# Patient Record
Sex: Female | Born: 1975 | Race: Black or African American | Hispanic: No | Marital: Married | Smoking: Never smoker
Health system: Southern US, Community
[De-identification: ages and names within clinical notes are randomized; demographics above are authoritative.]

## PROBLEM LIST (undated history)

## (undated) DIAGNOSIS — D649 Anemia, unspecified: Secondary | ICD-10-CM

## (undated) DIAGNOSIS — F32A Depression, unspecified: Secondary | ICD-10-CM

## (undated) DIAGNOSIS — J45909 Unspecified asthma, uncomplicated: Secondary | ICD-10-CM

## (undated) DIAGNOSIS — F329 Major depressive disorder, single episode, unspecified: Secondary | ICD-10-CM

## (undated) DIAGNOSIS — F419 Anxiety disorder, unspecified: Secondary | ICD-10-CM

## (undated) DIAGNOSIS — R51 Headache: Secondary | ICD-10-CM

## (undated) DIAGNOSIS — D219 Benign neoplasm of connective and other soft tissue, unspecified: Secondary | ICD-10-CM

## (undated) HISTORY — DX: Major depressive disorder, single episode, unspecified: F32.9

## (undated) HISTORY — DX: Unspecified asthma, uncomplicated: J45.909

## (undated) HISTORY — PX: URETHRAL DILATION: SUR417

## (undated) HISTORY — DX: Benign neoplasm of connective and other soft tissue, unspecified: D21.9

## (undated) HISTORY — DX: Anxiety disorder, unspecified: F41.9

## (undated) HISTORY — PX: KNEE SURGERY: SHX244

## (undated) HISTORY — DX: Depression, unspecified: F32.A

---

## 1997-11-07 ENCOUNTER — Emergency Department (HOSPITAL_COMMUNITY): Admission: EM | Admit: 1997-11-07 | Discharge: 1997-11-07 | Payer: Self-pay | Admitting: Emergency Medicine

## 1997-11-13 ENCOUNTER — Inpatient Hospital Stay (HOSPITAL_COMMUNITY): Admission: AD | Admit: 1997-11-13 | Discharge: 1997-11-13 | Payer: Self-pay | Admitting: Obstetrics

## 1998-01-20 ENCOUNTER — Other Ambulatory Visit: Admission: RE | Admit: 1998-01-20 | Discharge: 1998-01-20 | Payer: Self-pay | Admitting: Obstetrics

## 1998-12-18 ENCOUNTER — Inpatient Hospital Stay (HOSPITAL_COMMUNITY): Admission: AD | Admit: 1998-12-18 | Discharge: 1998-12-18 | Payer: Self-pay | Admitting: Obstetrics

## 1999-01-15 ENCOUNTER — Other Ambulatory Visit: Admission: RE | Admit: 1999-01-15 | Discharge: 1999-01-15 | Payer: Self-pay | Admitting: Obstetrics

## 1999-02-19 ENCOUNTER — Emergency Department (HOSPITAL_COMMUNITY): Admission: EM | Admit: 1999-02-19 | Discharge: 1999-02-19 | Payer: Self-pay | Admitting: *Deleted

## 1999-04-24 ENCOUNTER — Emergency Department (HOSPITAL_COMMUNITY): Admission: EM | Admit: 1999-04-24 | Discharge: 1999-04-25 | Payer: Self-pay | Admitting: *Deleted

## 1999-04-25 ENCOUNTER — Encounter: Payer: Self-pay | Admitting: Emergency Medicine

## 1999-12-28 ENCOUNTER — Inpatient Hospital Stay (HOSPITAL_COMMUNITY): Admission: AD | Admit: 1999-12-28 | Discharge: 1999-12-28 | Payer: Self-pay | Admitting: Obstetrics

## 2000-10-12 ENCOUNTER — Other Ambulatory Visit: Admission: RE | Admit: 2000-10-12 | Discharge: 2000-10-12 | Payer: Self-pay | Admitting: Obstetrics

## 2001-04-28 ENCOUNTER — Emergency Department (HOSPITAL_COMMUNITY): Admission: EM | Admit: 2001-04-28 | Discharge: 2001-04-28 | Payer: Self-pay | Admitting: Emergency Medicine

## 2001-05-07 ENCOUNTER — Emergency Department (HOSPITAL_COMMUNITY): Admission: EM | Admit: 2001-05-07 | Discharge: 2001-05-07 | Payer: Self-pay | Admitting: Emergency Medicine

## 2001-07-14 ENCOUNTER — Emergency Department (HOSPITAL_COMMUNITY): Admission: EM | Admit: 2001-07-14 | Discharge: 2001-07-14 | Payer: Self-pay | Admitting: Emergency Medicine

## 2002-01-18 ENCOUNTER — Encounter: Payer: Self-pay | Admitting: *Deleted

## 2002-01-18 ENCOUNTER — Emergency Department (HOSPITAL_COMMUNITY): Admission: EM | Admit: 2002-01-18 | Discharge: 2002-01-18 | Payer: Self-pay | Admitting: *Deleted

## 2002-01-19 ENCOUNTER — Emergency Department (HOSPITAL_COMMUNITY): Admission: EM | Admit: 2002-01-19 | Discharge: 2002-01-19 | Payer: Self-pay | Admitting: Emergency Medicine

## 2002-01-19 ENCOUNTER — Encounter: Payer: Self-pay | Admitting: Emergency Medicine

## 2002-05-25 ENCOUNTER — Emergency Department (HOSPITAL_COMMUNITY): Admission: EM | Admit: 2002-05-25 | Discharge: 2002-05-25 | Payer: Self-pay | Admitting: Unknown Physician Specialty

## 2002-10-11 ENCOUNTER — Emergency Department (HOSPITAL_COMMUNITY): Admission: EM | Admit: 2002-10-11 | Discharge: 2002-10-11 | Payer: Self-pay | Admitting: Emergency Medicine

## 2007-09-24 ENCOUNTER — Emergency Department (HOSPITAL_COMMUNITY): Admission: EM | Admit: 2007-09-24 | Discharge: 2007-09-24 | Payer: Self-pay | Admitting: Emergency Medicine

## 2008-04-17 ENCOUNTER — Ambulatory Visit (HOSPITAL_COMMUNITY): Admission: RE | Admit: 2008-04-17 | Discharge: 2008-04-17 | Payer: Self-pay | Admitting: Obstetrics

## 2008-06-28 ENCOUNTER — Emergency Department (HOSPITAL_COMMUNITY): Admission: EM | Admit: 2008-06-28 | Discharge: 2008-06-28 | Payer: Self-pay | Admitting: Emergency Medicine

## 2008-07-06 ENCOUNTER — Emergency Department (HOSPITAL_COMMUNITY): Admission: EM | Admit: 2008-07-06 | Discharge: 2008-07-06 | Payer: Self-pay | Admitting: Emergency Medicine

## 2010-05-24 ENCOUNTER — Encounter: Payer: Self-pay | Admitting: Obstetrics

## 2012-05-16 ENCOUNTER — Other Ambulatory Visit (HOSPITAL_COMMUNITY): Payer: Self-pay | Admitting: Obstetrics

## 2012-05-16 DIAGNOSIS — Z1231 Encounter for screening mammogram for malignant neoplasm of breast: Secondary | ICD-10-CM

## 2012-05-16 DIAGNOSIS — D259 Leiomyoma of uterus, unspecified: Secondary | ICD-10-CM

## 2012-05-16 DIAGNOSIS — N949 Unspecified condition associated with female genital organs and menstrual cycle: Secondary | ICD-10-CM

## 2012-05-25 ENCOUNTER — Ambulatory Visit (HOSPITAL_COMMUNITY): Payer: BC Managed Care – PPO

## 2012-05-25 ENCOUNTER — Ambulatory Visit (HOSPITAL_COMMUNITY)
Admission: RE | Admit: 2012-05-25 | Discharge: 2012-05-25 | Disposition: A | Discharge status: Home / Self Care | Payer: BC Managed Care – PPO | Source: Ambulatory Visit | Attending: Obstetrics | Admitting: Obstetrics

## 2012-05-25 DIAGNOSIS — Z1231 Encounter for screening mammogram for malignant neoplasm of breast: Secondary | ICD-10-CM | POA: Insufficient documentation

## 2012-05-29 ENCOUNTER — Other Ambulatory Visit: Payer: Self-pay | Admitting: Obstetrics

## 2012-05-29 DIAGNOSIS — R928 Other abnormal and inconclusive findings on diagnostic imaging of breast: Secondary | ICD-10-CM

## 2012-06-06 ENCOUNTER — Ambulatory Visit (HOSPITAL_COMMUNITY): Payer: BC Managed Care – PPO

## 2012-06-07 ENCOUNTER — Ambulatory Visit (HOSPITAL_COMMUNITY)
Admission: RE | Admit: 2012-06-07 | Discharge: 2012-06-07 | Disposition: A | Discharge status: Home / Self Care | Payer: BC Managed Care – PPO | Source: Ambulatory Visit | Attending: Obstetrics | Admitting: Obstetrics

## 2012-06-07 DIAGNOSIS — D251 Intramural leiomyoma of uterus: Secondary | ICD-10-CM | POA: Insufficient documentation

## 2012-06-07 DIAGNOSIS — N938 Other specified abnormal uterine and vaginal bleeding: Secondary | ICD-10-CM | POA: Insufficient documentation

## 2012-06-07 DIAGNOSIS — D250 Submucous leiomyoma of uterus: Secondary | ICD-10-CM | POA: Insufficient documentation

## 2012-06-07 DIAGNOSIS — N949 Unspecified condition associated with female genital organs and menstrual cycle: Secondary | ICD-10-CM | POA: Insufficient documentation

## 2012-06-07 DIAGNOSIS — D252 Subserosal leiomyoma of uterus: Secondary | ICD-10-CM | POA: Insufficient documentation

## 2012-06-07 DIAGNOSIS — D259 Leiomyoma of uterus, unspecified: Secondary | ICD-10-CM

## 2012-06-07 IMAGING — US US TRANSVAGINAL NON-OB
1 series · 13 of 25 positions shown · non-contrast
Comparison: [DATE]

CLINICAL DATA: Dysfunctional uterine bleeding, history of fibroids



[Series 1: us pelvis complete · 13 of 116 slices shown]
[im 1/116]
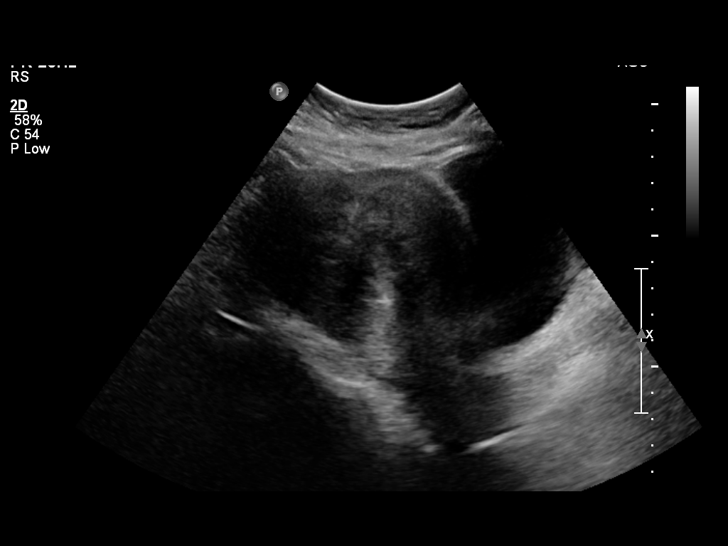
[im 10/116]
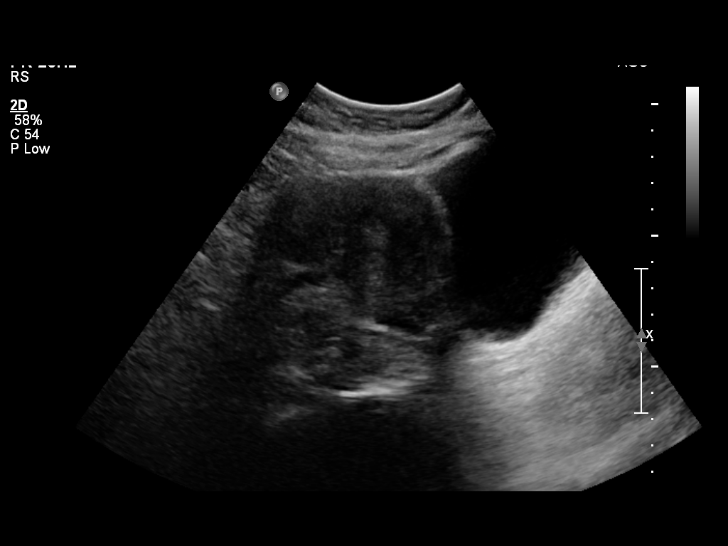
[im 20/116]
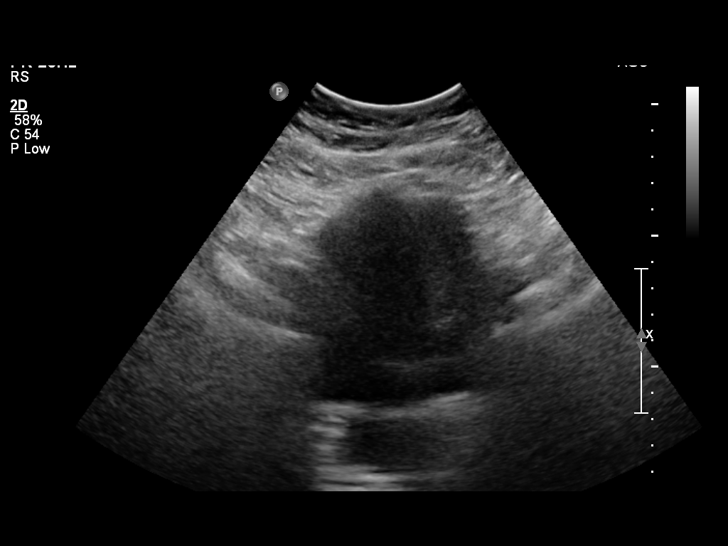
[im 29/116]
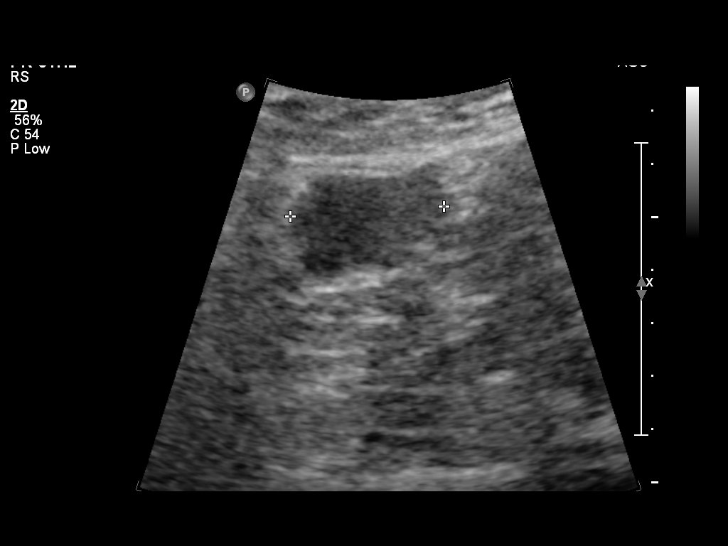
[im 39/116]
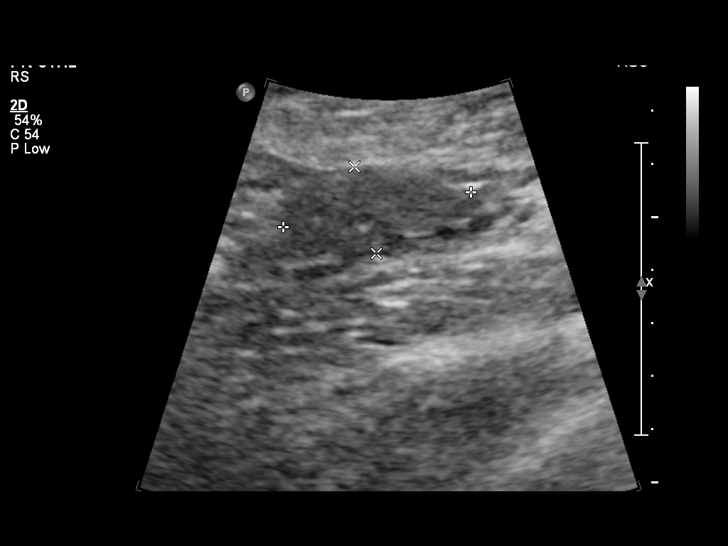
[im 48/116]
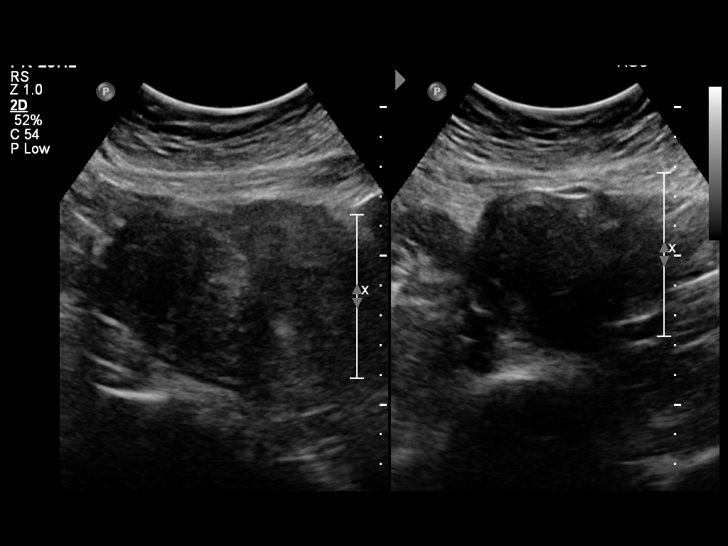
[im 58/116]
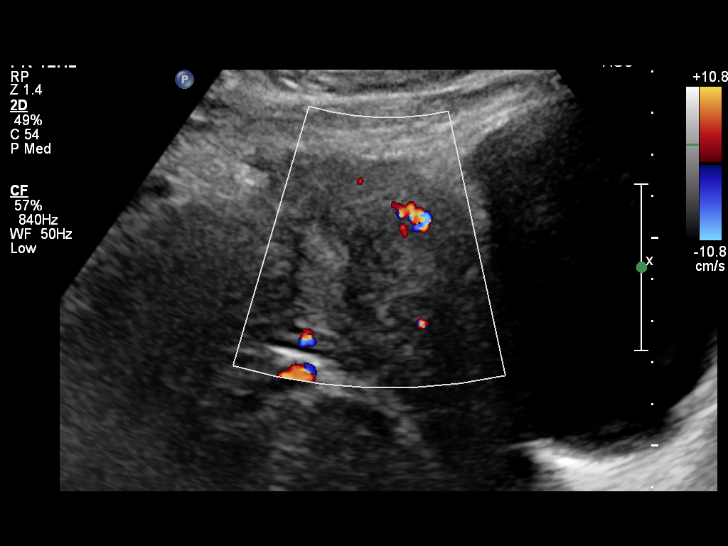
[im 68/116]
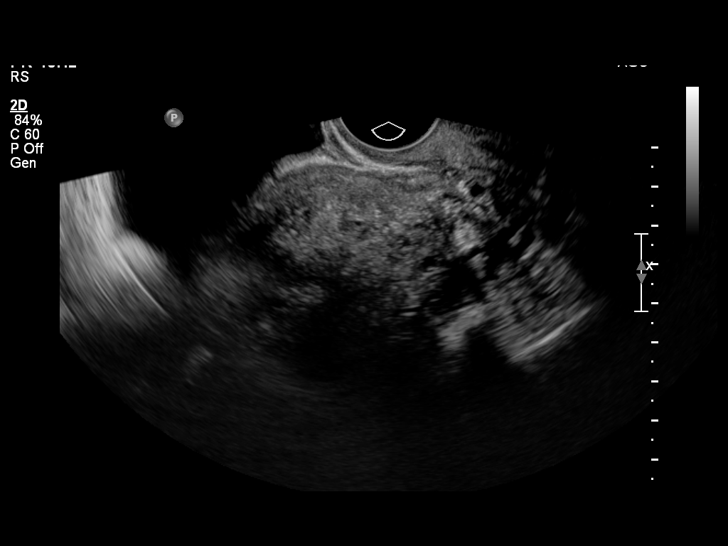
[im 77/116]
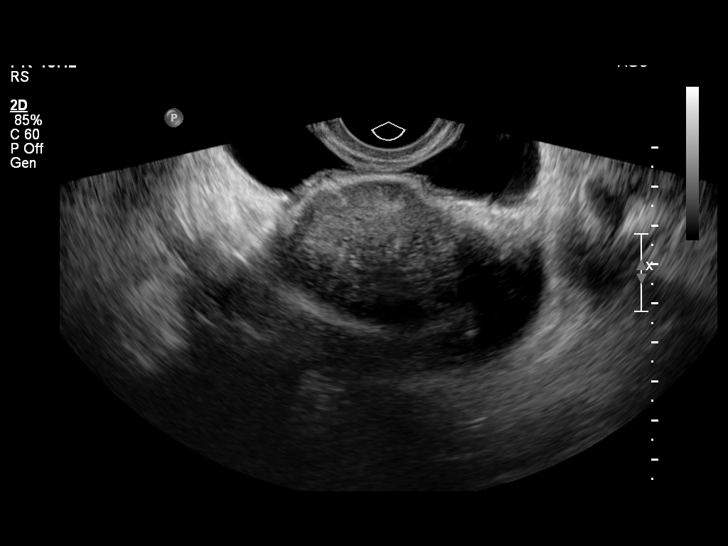
[im 87/116]
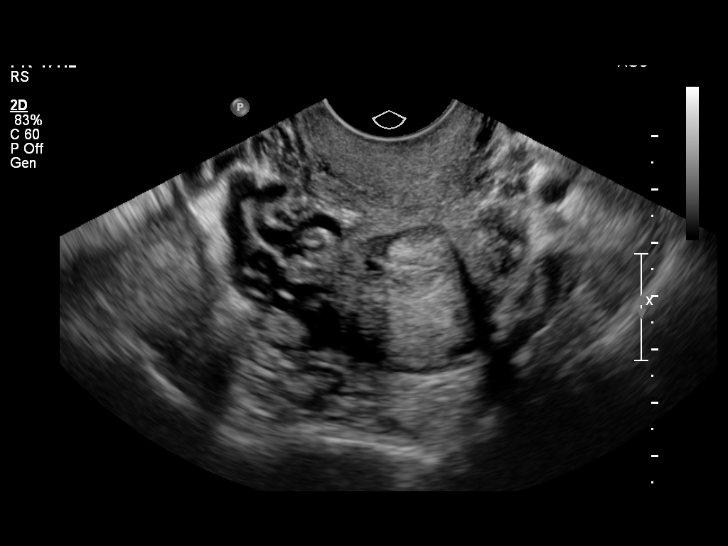
[im 96/116]
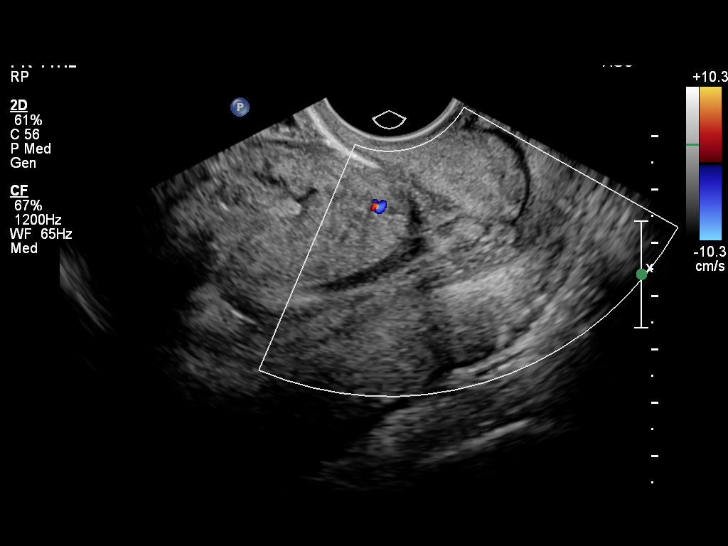
[im 106/116]
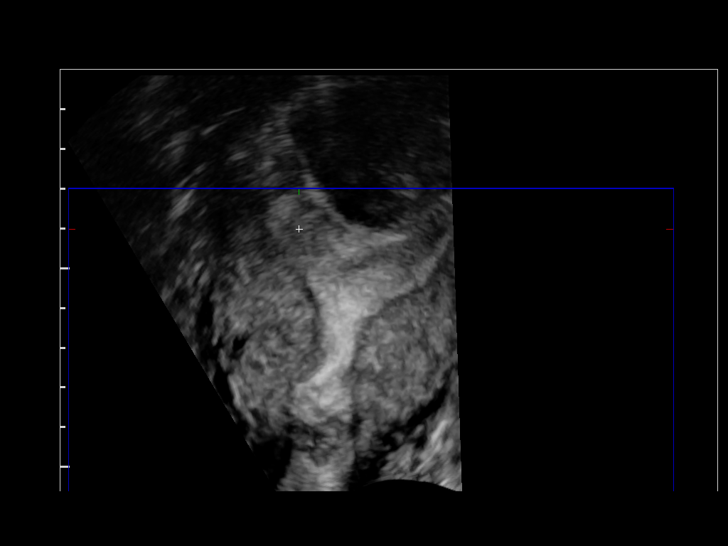
[im 116/116]
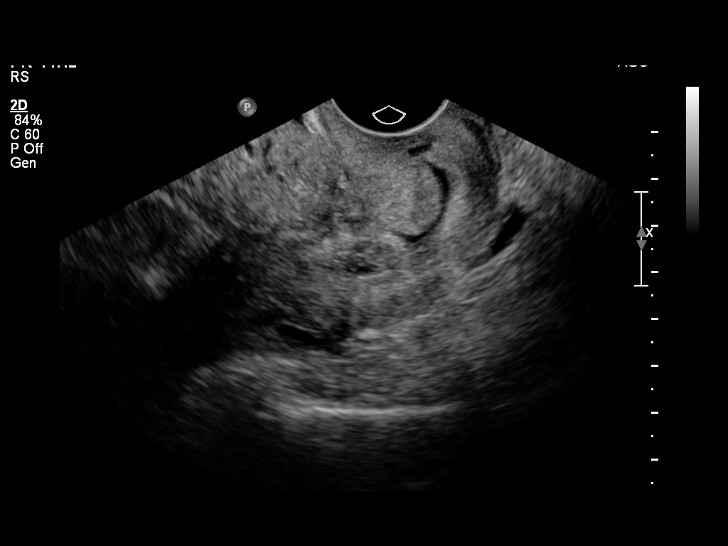

[13 of 25 positions shown; findings below may reference images not displayed]

FINDINGS: Uterus:  Anteverted, anteflexed.  11 x 8.2 x 7.3 cm.  Multiple
fibroids again noted, dominant posterior uterine body intramural
fibroid measuring 2.4 x 2.4 x 1.8 cm (smaller than previously,
previously 3.6 x 3.3 x 2.9 cm).  Other smaller intramural fibroids
are noted elsewhere without significant subserosal or submucosal
component.

Endometrium: 1.2 cm.  Inhomogeneous without focal measurable
abnormality.

Right ovary: 4.1 x 2.9 x 2.1 cm, seen only transabdominally,
normal.

Left ovary: 3.6 x 2.6 x 1.7 cm, seen transabdominally only, normal.

Other Findings:  Trace free pelvic fluid.
IMPRESSION: Uterine fibroids again noted, smaller than previously.

Mild prominence of the endometrium and inhomogeneity which could be
related to timing during the patient's menstrual cycle, although
underlying abnormality could account for the history of
dysfunctional uterine bleeding.  Repeat pelvic ultrasound during
the week immediately following the patient's menses is recommended
for better visualization.

## 2012-06-20 ENCOUNTER — Ambulatory Visit
Admission: RE | Admit: 2012-06-20 | Discharge: 2012-06-20 | Disposition: A | Discharge status: Home / Self Care | Payer: BC Managed Care – PPO | Source: Ambulatory Visit | Attending: Obstetrics | Admitting: Obstetrics

## 2012-06-20 ENCOUNTER — Other Ambulatory Visit: Payer: BC Managed Care – PPO

## 2012-06-20 ENCOUNTER — Other Ambulatory Visit: Payer: Self-pay | Admitting: Obstetrics

## 2012-06-20 DIAGNOSIS — R928 Other abnormal and inconclusive findings on diagnostic imaging of breast: Secondary | ICD-10-CM

## 2012-06-30 ENCOUNTER — Other Ambulatory Visit: Payer: Self-pay | Admitting: Radiology

## 2012-06-30 ENCOUNTER — Ambulatory Visit: Payer: BC Managed Care – PPO | Admitting: Family Medicine

## 2012-06-30 VITALS — BP 114/58 | HR 71 | Temp 98.4°F | Resp 16 | Ht 63.0 in | Wt 183.4 lb

## 2012-06-30 DIAGNOSIS — F329 Major depressive disorder, single episode, unspecified: Secondary | ICD-10-CM

## 2012-06-30 DIAGNOSIS — F411 Generalized anxiety disorder: Secondary | ICD-10-CM

## 2012-06-30 DIAGNOSIS — F32A Depression, unspecified: Secondary | ICD-10-CM

## 2012-06-30 DIAGNOSIS — D649 Anemia, unspecified: Secondary | ICD-10-CM

## 2012-06-30 DIAGNOSIS — F3289 Other specified depressive episodes: Secondary | ICD-10-CM

## 2012-06-30 LAB — COMPREHENSIVE METABOLIC PANEL
Albumin: 4.1 g/dL (ref 3.5–5.2)
Alkaline Phosphatase: 52 U/L (ref 39–117)
BUN: 10 mg/dL (ref 6–23)
CO2: 27 mEq/L (ref 19–32)
Calcium: 9.3 mg/dL (ref 8.4–10.5)
Glucose, Bld: 77 mg/dL (ref 70–99)
Potassium: 4.4 mEq/L (ref 3.5–5.3)
Sodium: 142 mEq/L (ref 135–145)
Total Protein: 7.2 g/dL (ref 6.0–8.3)

## 2012-06-30 LAB — TSH: TSH: 0.971 u[IU]/mL (ref 0.350–4.500)

## 2012-06-30 LAB — POCT CBC
Granulocyte percent: 68.8 %G (ref 37–80)
HCT, POC: 31.4 % — AB (ref 37.7–47.9)
Hemoglobin: 9.1 g/dL — AB (ref 12.2–16.2)
Lymph, poc: 4.7 — AB (ref 0.6–3.4)
MCH, POC: 20.3 pg — AB (ref 27–31.2)
MCHC: 29 g/dL — AB (ref 31.8–35.4)
MCV: 69.9 fL — AB (ref 80–97)
MID (cbc): 0.8 (ref 0–0.9)
MPV: 9.2 fL (ref 0–99.8)
POC Granulocyte: 12 — AB (ref 2–6.9)
POC LYMPH PERCENT: 26.7 %L (ref 10–50)
POC MID %: 4.5 % (ref 0–12)
Platelet Count, POC: 486 10*3/uL — AB (ref 142–424)
RBC: 4.49 M/uL (ref 4.04–5.48)
RDW, POC: 23.6 %
WBC: 17.5 10*3/uL — AB (ref 4.6–10.2)

## 2012-06-30 LAB — COMPREHENSIVE METABOLIC PANEL WITH GFR
ALT: <8 U/L (ref 0–35)
AST: 11 U/L (ref 0–37)
Chloride: 107 meq/L (ref 96–112)
Creat: 0.52 mg/dL (ref 0.50–1.10)
Total Bilirubin: 0.3 mg/dL (ref 0.3–1.2)

## 2012-06-30 MED ORDER — SERTRALINE HCL 25 MG PO TABS: 25.0000 mg | ORAL_TABLET | Freq: Every day | ORAL | Status: DC

## 2012-06-30 MED ORDER — CLONAZEPAM 1 MG PO TABS: 1.0000 mg | ORAL_TABLET | Freq: Two times a day (BID) | ORAL | Status: DC | PRN

## 2012-06-30 NOTE — Progress Notes (Signed)
[facility]:  Office Visit  Chief Complaint:  Chief Complaint  Patient presents with  . Anxiety    ATTACK 06/30/12    HPI: Julia Franklin is a 37 y.o. female who complains of  Anxiety attack today, was at work when it got triggered. Has had it for some time but today was the worsem she had CP and palpitations and had to go into another room to calm herself. 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 She took a demotion and lost $11,000  Pay to being an International aid/development worker, now there is only one manger who is working remotely so she has had to pick up the same duties she was trying to get away from in the first place. She dreams about work. There has been some new leadership changes, and there is a  Constant threat to her job  and  It has been hard for her to adjust so she stepped down. Has had mood, appetite and sleep changes due to job. Has had anxiety before but never been on medicines.  Her husband is unemployed was diagnosed with Vit K deficiency and is on Coumadin so he bleeds out easily and is unable to do the work he was doing before due to this liability. No h/o SI/HI/hallucinations/psych hospitalizations.   She is on amox for tonsillitis and also a prednisone for inflammation     Past Medical History  Diagnosis Date  . Asthma   . Anxiety   . Fibroids    Past Surgical History  Procedure Laterality Date  . Knee surgery      left  ACL  . Cesarean section  1993   History   Social History  . Marital Status: Married    Spouse Name: N/A    Number of Children: N/A  . Years of Education: N/A   Social History Main Topics  . Smoking status: Never Smoker   . Smokeless tobacco: None  . Alcohol Use: Yes     Comment: socially  . Drug  Use: No  . Sexually Active: Yes     Comment: number of sex partners in the last 12 months  1   Other Topics Concern  . None   Social History Narrative  . None   Family History  Problem Relation Age of Onset  . Hypertension Mother   . Stroke Mother   . Diabetes Father   . Stroke Father   . Parkinson's disease Maternal Grandmother   . Stroke Paternal Grandmother   . Cancer Sister     lung  . Cancer Brother     lung   No Known Allergies Prior to Admission medications   Medication Sig Start Date End Date Taking? Authorizing Provider  AMOXICILLIN PO Take by mouth 2 (two) times daily.   Yes Historical Provider, MD  PREDNISONE, PAK, PO Take by mouth daily.   Yes Historical Provider, MD     ROS: The patient currently denies fevers, chills, night sweats, unintentional weight loss, chest pain, palpitations, wheezing, dyspnea on exertion, nausea, vomiting, abdominal pain, dysuria, hematuria, melena, numbness, weakness, or tingling.   All other systems have been reviewed and were otherwise negative with the exception of those mentioned in the HPI and as  above.    PHYSICAL EXAM: Filed Vitals:   06/30/12 1349  BP: 114/58  Pulse: 71  Temp: 98.4 F (36.9 C)  Resp: 16   Filed Vitals:   06/30/12 1349  Height: 5\' 3"  (1.6 m)  Weight: 183 lb 6.4 oz (83.19 kg)   Body mass index is 32.5 kg/(m^2).  General: Alert, tearful, withdrawn HEENT:  Normocephalic, atraumatic, oropharynx patent. EOMI, PERRLA Cardiovascular:  Regular rate and rhythm, no rubs murmurs or gallops.  No Carotid bruits, radial pulse intact. No pedal edema.  Respiratory: Clear to auscultation bilaterally.  No wheezes, rales, or rhonchi.  No cyanosis, no use of accessory musculature GI: No organomegaly, abdomen is soft and non-tender, positive bowel sounds.  No masses. Skin: No rashes. Neurologic: Facial musculature symmetric. Psychiatric: Patient is appropriate throughout our interaction. She is tearful,  withdrawn Lymphatic: No cervical lymphadenopathy Musculoskeletal: Gait intact.   LABS: Results for orders placed in visit on 06/30/12  POCT CBC      Result Value Range   WBC 17.5 (*) 4.6 - 10.2 K/uL   Lymph, poc 4.7 (*) 0.6 - 3.4   POC LYMPH PERCENT 26.7  10 - 50 %L   MID (cbc) 0.8  0 - 0.9   POC MID % 4.5  0 - 12 %M   POC Granulocyte 12.0 (*) 2 - 6.9   Granulocyte percent 68.8  37 - 80 %G   RBC 4.49  4.04 - 5.48 M/uL   Hemoglobin 9.1 (*) 12.2 - 16.2 g/dL   HCT, POC 09.8 (*) 11.9 - 47.9 %   MCV 69.9 (*) 80 - 97 fL   MCH, POC 20.3 (*) 27 - 31.2 pg   MCHC 29.0 (*) 31.8 - 35.4 g/dL   RDW, POC 14.7     Platelet Count, POC 486 (*) 142 - 424 K/uL   MPV 9.2  0 - 99.8 fL     EKG/XRAY:   Primary read interpreted by Dr. Conley Rolls at [facility].   ASSESSMENT/PLAN: Encounter Diagnoses  Name Primary?  . Generalized anxiety disorder Yes  . Anemia, unspecified    Zoloft 25 mg PO daily Klonopin 1 mg  1/2 tab po BID prn  D/w patient SEs and also that I would like her to go into therapy , however at this time she is not able to to take time off to do this, she is the sole breadwinner for family,she works over 40 hrs/wk and is afraid of losing her job.  F/u prn or in 4- 6 weeks Work note given for 3 days off, Return to work on 07/03/12.  Zung Anxiety Score Anxiety index 64---Marked to Severe Anxiety Becks Depression Score 39---Severe Depression Go to ER prn if have SI/HI/Hallucinations   * Leukocytosis due to prednisone use for tonsillitis.   Hamilton Capri PHUONG, DO 06/30/2012 2:57 PM

## 2012-06-30 NOTE — Telephone Encounter (Signed)
Sig clarified on the Klonopin for patient at [facility].

## 2012-07-01 ENCOUNTER — Telehealth: Payer: Self-pay | Admitting: *Deleted

## 2012-07-20 NOTE — Telephone Encounter (Signed)
Med encounter

## 2012-08-04 ENCOUNTER — Ambulatory Visit: Payer: BC Managed Care – PPO | Admitting: Family Medicine

## 2012-12-13 ENCOUNTER — Other Ambulatory Visit: Payer: Self-pay | Admitting: Obstetrics

## 2013-01-24 ENCOUNTER — Encounter (HOSPITAL_COMMUNITY): Payer: Self-pay | Admitting: Pharmacy Technician

## 2013-01-24 ENCOUNTER — Encounter (HOSPITAL_COMMUNITY): Payer: Self-pay

## 2013-01-24 ENCOUNTER — Encounter (HOSPITAL_COMMUNITY)
Admission: RE | Admit: 2013-01-24 | Discharge: 2013-01-24 | Disposition: A | Discharge status: Home / Self Care | Payer: BC Managed Care – PPO | Source: Ambulatory Visit | Attending: Obstetrics | Admitting: Obstetrics

## 2013-01-24 DIAGNOSIS — Z01818 Encounter for other preprocedural examination: Secondary | ICD-10-CM | POA: Insufficient documentation

## 2013-01-24 DIAGNOSIS — Z01812 Encounter for preprocedural laboratory examination: Secondary | ICD-10-CM | POA: Insufficient documentation

## 2013-01-24 HISTORY — DX: Headache: R51

## 2013-01-24 HISTORY — DX: Anemia, unspecified: D64.9

## 2013-01-24 LAB — CBC
Platelets: 336 10*3/uL (ref 150–400)
RBC: 4.26 MIL/uL (ref 3.87–5.11)
RDW: 17 % — ABNORMAL HIGH (ref 11.5–15.5)
WBC: 8.8 10*3/uL (ref 4.0–10.5)

## 2013-01-24 LAB — ABO/RH: ABO/RH(D): AB POS

## 2013-01-24 LAB — TYPE AND SCREEN
ABO/RH(D): AB POS
Antibody Screen: NEGATIVE

## 2013-01-24 NOTE — Patient Instructions (Signed)
Your procedure is scheduled on: 01/31/2013  Enter through the Main Entrance of [facility] at: 0600am  Pick up the phone at the desk and dial [phone].  Call this number if you have problems the morning of surgery: [phone].  Remember: Do NOT eat food: AFTER MIDNIGHT 01/30/2013 Do NOT drink clear liquids after: AFTER MIDNIGHT 01/30/2013 Take these medicines the morning of surgery with a SIP OF WATER: N/A  Do NOT wear jewelry, make-up, or nail polish. Do NOT wear lotions, powders, or perfumes.  You may wear deoderant. Do NOT shave for 48 hours prior to surgery. Do NOT bring valuables to the hospital. Contacts, dentures, or bridgework may not be worn into surgery. Leave suitcase in car.  After surgery it may be brought to your room.  For patients admitted to the hospital, checkout time is 11:00 AM the day of discharge.

## 2013-01-26 NOTE — H&P (Signed)
NAMETRANICE, LADUKE                 ACCOUNT NO.:  [id number]  MEDICAL RECORD NO.:  [id number]  LOCATION:  PERIO                         FACILITY:  [facility]  PHYSICIAN:  Kathreen Cosier, M.D.DATE OF BIRTH:  26-May-1975  DATE OF ADMISSION:  12/13/2012 DATE OF DISCHARGE:                             HISTORY & PHYSICAL   DATE OF SURGERY:  January 31, 2013.  The patient is a 37 year old, gravida 1, para 1-0-0-1, who plans to get pregnant in the future.  She has a long history of myomas which have been increasing in size, and she was admitted for myomectomy.  PAST MEDICAL HISTORY:  Negative.  PAST SURGICAL HISTORY:  Negative.  SOCIAL HISTORY:  Negative.  SYSTEM REVIEW:  She is allergic to penicillin.  PHYSICAL EXAMINATION:  GENERAL:  A well-developed female in no distress. HEENT:  Negative. LUNGS:  Clear to P and A. HEART:  Regular rhythm.  No murmurs.  No gallops. BREASTS:  Negative. ABDOMEN:  Mass arising from the pelvis confirmed with ultrasound to view multiple myomas.  Pap negative. EXTREMITIES:  Negative.  DIAGNOSIS:  Multiple myomas for myomectomy.          ______________________________ Kathreen Cosier, M.D.     BAM/MEDQ  D:  01/25/2013  T:  01/26/2013  Job:  [id number]

## 2013-01-30 MED ORDER — CEFAZOLIN SODIUM-DEXTROSE 2-3 GM-% IV SOLR: 2.0000 g | Freq: Once | INTRAVENOUS | Status: DC

## 2013-01-31 ENCOUNTER — Inpatient Hospital Stay (HOSPITAL_COMMUNITY): Payer: BC Managed Care – PPO | Admitting: Anesthesiology

## 2013-01-31 ENCOUNTER — Inpatient Hospital Stay (HOSPITAL_COMMUNITY)
Admission: RE | Admit: 2013-01-31 | Discharge: 2013-02-02 | DRG: 359 | Disposition: A | Discharge status: Home / Self Care | Payer: BC Managed Care – PPO | Source: Ambulatory Visit | Attending: Obstetrics | Admitting: Obstetrics

## 2013-01-31 ENCOUNTER — Encounter (HOSPITAL_COMMUNITY): Payer: Self-pay | Admitting: Anesthesiology

## 2013-01-31 ENCOUNTER — Encounter (HOSPITAL_COMMUNITY)
Admission: RE | Disposition: A | Discharge status: Home / Self Care | Payer: Self-pay | Source: Ambulatory Visit | Attending: Obstetrics

## 2013-01-31 DIAGNOSIS — D251 Intramural leiomyoma of uterus: Principal | ICD-10-CM | POA: Diagnosis present

## 2013-01-31 DIAGNOSIS — Z880 Allergy status to penicillin: Secondary | ICD-10-CM

## 2013-01-31 HISTORY — PX: MYOMECTOMY: SHX85

## 2013-01-31 LAB — COMPREHENSIVE METABOLIC PANEL
ALT: 6 U/L (ref 0–35)
AST: 14 U/L (ref 0–37)
Alkaline Phosphatase: 47 U/L (ref 39–117)
BUN: 11 mg/dL (ref 6–23)
Chloride: 105 mEq/L (ref 96–112)
GFR calc Af Amer: >90 mL/min (ref >90)
GFR calc non Af Amer: >90 mL/min (ref >90)
Glucose, Bld: 133 mg/dL — ABNORMAL HIGH (ref 70–99)
Potassium: 3.9 mEq/L (ref 3.5–5.1)
Sodium: 138 mEq/L (ref 135–145)
Total Protein: 6.3 g/dL (ref 6.0–8.3)

## 2013-01-31 LAB — TYPE AND SCREEN
ABO/RH(D): AB POS
Antibody Screen: NEGATIVE

## 2013-01-31 LAB — HCG, QUANTITATIVE, PREGNANCY: hCG, Beta Chain, Quant, S: <1 m[IU]/mL (ref <5)

## 2013-01-31 SURGERY — MYOMECTOMY, ABDOMINAL APPROACH
Anesthesia: General | Site: Abdomen | Wound class: Clean

## 2013-01-31 MED ORDER — DIPHENHYDRAMINE HCL 50 MG/ML IJ SOLN: 12.5000 mg | Freq: Four times a day (QID) | INTRAMUSCULAR | Status: DC | PRN

## 2013-01-31 MED ORDER — DIPHENHYDRAMINE HCL 12.5 MG/5ML PO ELIX: 12.5000 mg | ORAL_SOLUTION | Freq: Four times a day (QID) | ORAL | Status: DC | PRN

## 2013-01-31 MED ORDER — ACETAMINOPHEN 325 MG PO TABS
650.0000 mg | ORAL_TABLET | ORAL | Status: DC | PRN
  Administered 2013-02-01: 650 mg via ORAL
  Filled 2013-01-31: qty 2

## 2013-01-31 MED ORDER — SIMETHICONE 80 MG PO CHEW
80.0000 mg | CHEWABLE_TABLET | Freq: Four times a day (QID) | ORAL | Status: DC | PRN
  Administered 2013-02-01 (×2): 80 mg via ORAL

## 2013-01-31 MED ORDER — HYDROMORPHONE HCL PF 1 MG/ML IJ SOLN
0.2500 mg | INTRAMUSCULAR | Status: DC | PRN
  Administered 2013-01-31 (×4): 0.5 mg via INTRAVENOUS

## 2013-01-31 MED ORDER — PROPOFOL 10 MG/ML IV BOLUS
INTRAVENOUS | Status: DC | PRN
  Administered 2013-01-31: 170 mg via INTRAVENOUS
  Administered 2013-01-31: 30 mg via INTRAVENOUS

## 2013-01-31 MED ORDER — ROCURONIUM BROMIDE 100 MG/10ML IV SOLN
INTRAVENOUS | Status: DC | PRN
  Administered 2013-01-31: 40 mg via INTRAVENOUS

## 2013-01-31 MED ORDER — NEOSTIGMINE METHYLSULFATE 1 MG/ML IJ SOLN
INTRAMUSCULAR | Status: DC | PRN
  Administered 2013-01-31: 4 mg via INTRAVENOUS

## 2013-01-31 MED ORDER — ONDANSETRON HCL 4 MG/2ML IJ SOLN
INTRAMUSCULAR | Status: AC
  Filled 2013-01-31: qty 2

## 2013-01-31 MED ORDER — DIPHENHYDRAMINE HCL 50 MG/ML IJ SOLN
12.5000 mg | Freq: Four times a day (QID) | INTRAMUSCULAR | Status: DC | PRN
Start: 2013-01-31 — End: 2013-01-31

## 2013-01-31 MED ORDER — ALUM & MAG HYDROXIDE-SIMETH 200-200-20 MG/5ML PO SUSP: 30.0000 mL | ORAL | Status: DC | PRN

## 2013-01-31 MED ORDER — ONDANSETRON HCL 4 MG/2ML IJ SOLN: 4.0000 mg | Freq: Four times a day (QID) | INTRAMUSCULAR | Status: DC | PRN

## 2013-01-31 MED ORDER — MIDAZOLAM HCL 2 MG/2ML IJ SOLN
INTRAMUSCULAR | Status: DC | PRN
  Administered 2013-01-31: 2 mg via INTRAVENOUS

## 2013-01-31 MED ORDER — HYDROMORPHONE HCL PF 1 MG/ML IJ SOLN
0.2000 mg | INTRAMUSCULAR | Status: DC | PRN
  Administered 2013-01-31 – 2013-02-01 (×5): 0.6 mg via INTRAVENOUS
  Filled 2013-01-31 (×6): qty 1

## 2013-01-31 MED ORDER — HYDROMORPHONE HCL PF 1 MG/ML IJ SOLN
INTRAMUSCULAR | Status: AC
  Filled 2013-01-31: qty 1

## 2013-01-31 MED ORDER — PRENATAL MULTIVITAMIN CH
1.0000 | ORAL_TABLET | Freq: Every day | ORAL | Status: DC
  Administered 2013-01-31 – 2013-02-01 (×2): 1 via ORAL
  Filled 2013-01-31 (×2): qty 1

## 2013-01-31 MED ORDER — NALOXONE HCL 0.4 MG/ML IJ SOLN: 0.4000 mg | INTRAMUSCULAR | Status: DC | PRN

## 2013-01-31 MED ORDER — OXYCODONE-ACETAMINOPHEN 5-325 MG PO TABS
1.0000 | ORAL_TABLET | ORAL | Status: DC | PRN
  Administered 2013-02-01 (×3): 2 via ORAL
  Administered 2013-02-01: 1 via ORAL
  Filled 2013-01-31 (×2): qty 2
  Filled 2013-01-31: qty 1
  Filled 2013-01-31 (×3): qty 2

## 2013-01-31 MED ORDER — FENTANYL CITRATE 0.05 MG/ML IJ SOLN
INTRAMUSCULAR | Status: AC
  Filled 2013-01-31: qty 5

## 2013-01-31 MED ORDER — CLINDAMYCIN PHOSPHATE 900 MG/50ML IV SOLN
INTRAVENOUS | Status: DC | PRN
  Administered 2013-01-31: 900 mg via INTRAVENOUS

## 2013-01-31 MED ORDER — LIDOCAINE HCL (CARDIAC) 20 MG/ML IV SOLN
INTRAVENOUS | Status: DC | PRN
  Administered 2013-01-31: 30 mg via INTRAVENOUS
  Administered 2013-01-31: 70 mg via INTRAVENOUS

## 2013-01-31 MED ORDER — PROPOFOL 10 MG/ML IV EMUL
INTRAVENOUS | Status: AC
  Filled 2013-01-31: qty 20

## 2013-01-31 MED ORDER — DEXAMETHASONE SODIUM PHOSPHATE 10 MG/ML IJ SOLN
INTRAMUSCULAR | Status: DC | PRN
  Administered 2013-01-31: 10 mg via INTRAVENOUS

## 2013-01-31 MED ORDER — DEXAMETHASONE SODIUM PHOSPHATE 10 MG/ML IJ SOLN
INTRAMUSCULAR | Status: AC
  Filled 2013-01-31: qty 1

## 2013-01-31 MED ORDER — ROCURONIUM BROMIDE 50 MG/5ML IV SOLN
INTRAVENOUS | Status: AC
  Filled 2013-01-31: qty 1

## 2013-01-31 MED ORDER — FENTANYL CITRATE 0.05 MG/ML IJ SOLN
INTRAMUSCULAR | Status: DC | PRN
  Administered 2013-01-31 (×2): 50 ug via INTRAVENOUS
  Administered 2013-01-31: 100 ug via INTRAVENOUS
  Administered 2013-01-31: 50 ug via INTRAVENOUS

## 2013-01-31 MED ORDER — NEOSTIGMINE METHYLSULFATE 1 MG/ML IJ SOLN
INTRAMUSCULAR | Status: AC
  Filled 2013-01-31: qty 1

## 2013-01-31 MED ORDER — GLYCOPYRROLATE 0.2 MG/ML IJ SOLN
INTRAMUSCULAR | Status: DC | PRN
  Administered 2013-01-31: 0.6 mg via INTRAVENOUS

## 2013-01-31 MED ORDER — ACETAMINOPHEN 160 MG/5ML PO SOLN
ORAL | Status: AC
  Administered 2013-01-31: 975 mg via ORAL
  Filled 2013-01-31: qty 20.3

## 2013-01-31 MED ORDER — SODIUM CHLORIDE 0.9 % IJ SOLN: 9.0000 mL | INTRAMUSCULAR | Status: DC | PRN

## 2013-01-31 MED ORDER — METOCLOPRAMIDE HCL 5 MG/ML IJ SOLN: 10.0000 mg | Freq: Four times a day (QID) | INTRAMUSCULAR | Status: DC | PRN

## 2013-01-31 MED ORDER — HYDROMORPHONE HCL PF 1 MG/ML IJ SOLN
INTRAMUSCULAR | Status: DC | PRN
  Administered 2013-01-31 (×2): 0.5 mg via INTRAVENOUS

## 2013-01-31 MED ORDER — LACTATED RINGERS IV SOLN
INTRAVENOUS | Status: DC
  Administered 2013-01-31: 07:00:00 via INTRAVENOUS

## 2013-01-31 MED ORDER — ONDANSETRON HCL 4 MG/2ML IJ SOLN
INTRAMUSCULAR | Status: DC | PRN
  Administered 2013-01-31: 4 mg via INTRAVENOUS

## 2013-01-31 MED ORDER — HYDROMORPHONE HCL PF 1 MG/ML IJ SOLN
INTRAMUSCULAR | Status: AC
  Administered 2013-01-31: 0.5 mg via INTRAVENOUS
  Filled 2013-01-31: qty 1

## 2013-01-31 MED ORDER — GLYCOPYRROLATE 0.2 MG/ML IJ SOLN
INTRAMUSCULAR | Status: AC
  Filled 2013-01-31: qty 3

## 2013-01-31 MED ORDER — CEFAZOLIN SODIUM-DEXTROSE 2-3 GM-% IV SOLR
INTRAVENOUS | Status: AC
  Filled 2013-01-31: qty 50

## 2013-01-31 MED ORDER — 0.9 % SODIUM CHLORIDE (POUR BTL) OPTIME
TOPICAL | Status: DC | PRN
  Administered 2013-01-31: 1000 mL

## 2013-01-31 MED ORDER — KETOROLAC TROMETHAMINE 30 MG/ML IJ SOLN: 15.0000 mg | Freq: Once | INTRAMUSCULAR | Status: DC | PRN

## 2013-01-31 MED ORDER — METOCLOPRAMIDE HCL 5 MG/ML IJ SOLN: 10.0000 mg | Freq: Once | INTRAMUSCULAR | Status: DC | PRN

## 2013-01-31 MED ORDER — ACETAMINOPHEN 160 MG/5ML PO SOLN
975.0000 mg | Freq: Once | ORAL | Status: AC
  Administered 2013-01-31: 975 mg via ORAL

## 2013-01-31 MED ORDER — CLINDAMYCIN PHOSPHATE 900 MG/50ML IV SOLN
900.0000 mg | Freq: Once | INTRAVENOUS | Status: AC
  Administered 2013-01-31: 900 mg via INTRAVENOUS
  Filled 2013-01-31: qty 50

## 2013-01-31 MED ORDER — LACTATED RINGERS IV SOLN
INTRAVENOUS | Status: DC
  Administered 2013-01-31 (×4): via INTRAVENOUS

## 2013-01-31 MED ORDER — MIDAZOLAM HCL 2 MG/2ML IJ SOLN
INTRAMUSCULAR | Status: AC
  Filled 2013-01-31: qty 2

## 2013-01-31 SURGICAL SUPPLY — 32 items
CANISTER SUCTION 2500CC (MISCELLANEOUS) ×2 IMPLANT
CHLORAPREP W/TINT 26ML (MISCELLANEOUS) ×2 IMPLANT
CLOTH BEACON ORANGE TIMEOUT ST (SAFETY) ×2 IMPLANT
CONT PATH 16OZ SNAP LID 3702 (MISCELLANEOUS) ×2 IMPLANT
DECANTER SPIKE VIAL GLASS SM (MISCELLANEOUS) ×2 IMPLANT
DERMABOND ADHESIVE PROPEN (GAUZE/BANDAGES/DRESSINGS) ×1
DERMABOND ADVANCED .7 DNX6 (GAUZE/BANDAGES/DRESSINGS) ×1 IMPLANT
DRSG OPSITE POSTOP 4X10 (GAUZE/BANDAGES/DRESSINGS) ×2 IMPLANT
GAUZE SPONGE 4X4 16PLY XRAY LF (GAUZE/BANDAGES/DRESSINGS) ×4 IMPLANT
GLOVE BIO SURGEON STRL SZ8.5 (GLOVE) ×2 IMPLANT
GOWN PREVENTION PLUS LG XLONG (DISPOSABLE) ×4 IMPLANT
GOWN PREVENTION PLUS XXLARGE (GOWN DISPOSABLE) ×2 IMPLANT
NEEDLE HYPO 25X1 1.5 SAFETY (NEEDLE) ×2 IMPLANT
NS IRRIG 1000ML POUR BTL (IV SOLUTION) ×2 IMPLANT
PACK ABDOMINAL GYN (CUSTOM PROCEDURE TRAY) ×2 IMPLANT
PAD OB MATERNITY 4.3X12.25 (PERSONAL CARE ITEMS) ×2 IMPLANT
STAPLER VISISTAT 35W (STAPLE) ×2 IMPLANT
SUT CHROMIC 0 CT 1 (SUTURE) IMPLANT
SUT CHROMIC 1 (SUTURE) IMPLANT
SUT CHROMIC 1 CTX 36 (SUTURE) ×6 IMPLANT
SUT CHROMIC 1MO 4 18 CR8 (SUTURE) ×4 IMPLANT
SUT CHROMIC 2 0 SH (SUTURE) ×4 IMPLANT
SUT MON AB 4-0 PS1 27 (SUTURE) ×2 IMPLANT
SUT PLAIN 2 0 XLH (SUTURE) IMPLANT
SUT VIC AB 0 CT1 18XCR BRD8 (SUTURE) IMPLANT
SUT VIC AB 0 CT1 27 (SUTURE) ×1
SUT VIC AB 0 CT1 27XBRD ANBCTR (SUTURE) ×1 IMPLANT
SUT VIC AB 0 CT1 8-18 (SUTURE)
SYR CONTROL 10ML LL (SYRINGE) ×2 IMPLANT
TOWEL OR 17X24 6PK STRL BLUE (TOWEL DISPOSABLE) ×4 IMPLANT
TRAY FOLEY CATH 14FR (SET/KITS/TRAYS/PACK) ×2 IMPLANT
WATER STERILE IRR 1000ML POUR (IV SOLUTION) ×2 IMPLANT

## 2013-01-31 NOTE — Anesthesia Postprocedure Evaluation (Signed)
  Anesthesia Post-op Note  Anesthesia Post Note  Patient: Julia Franklin  Procedure(s) Performed: Procedure(s) (LRB): ABDOMINAL MYOMECTOMY (N/A)  Anesthesia type: General  Patient location: [facility]  Post pain: Pain level controlled  Post assessment: Post-op Vital signs reviewed  Last Vitals:  Filed Vitals:   01/31/13 1528  BP: 116/56  Pulse: 63  Temp: 36.8 C  Resp: 16    Post vital signs: Reviewed  Level of consciousness: sedated  Complications: No apparent anesthesia complications

## 2013-01-31 NOTE — Op Note (Signed)
preop diagnosis multiple myomas Postop diagnosis multiple myomectomy Anesthesia Gen. Surgeon Dr. Francoise Ceo First assistant Dr. Coral Ceo Procedure patient placed on the operating table in the supine position abdomen prepped and draped bladder emptied with a Foley catheter a transverse suprapubic incision made carried down to the rectus fascia fascia cleaned and incised the length of the incision recti muscles retracted laterally peritoneum incised longitudinally they uterus was exteriorized and is a large fundal myoma x2 approximately 8 cm and 6 cm using transverse incision on the fundus both enlarged myomatous were removed and it was noted on the right side of the uterus is another large myoma an incision made above the myoma and a myoma was shelled out there was also myoma intramurally which impinged on the endometrial cavity and this was removed hemostasis was achieved with deep sutures of interrupted #1 chromic through each incision and the uterus closed a total of 3 layers with interrupted #1 chromic suture blood loss was 400 cc lap and sponge counts correct tubes and ovaries were normal in the abdomen chosen as peritoneum continuous with 2-0 chromic fascia continuous with of 0 Dexon and the skin shows a subcuticular stitch of 4-0 Monocryl in

## 2013-01-31 NOTE — H&P (Signed)
  There has been no change in her history and physical since the time of the original dictation

## 2013-01-31 NOTE — Anesthesia Preprocedure Evaluation (Signed)
Anesthesia Evaluation  Patient identified by MRN, date of birth, ID band Patient awake    Reviewed: Allergy & Precautions, H&P , NPO status , Patient's Chart, lab work & pertinent test results, reviewed documented beta blocker date and time   History of Anesthesia Complications Negative for: history of anesthetic complications  Airway Mallampati: III TM Distance: >3 FB Neck ROM: full    Dental  (+) Teeth Intact   Pulmonary neg pulmonary ROS,  breath sounds clear to auscultation  Pulmonary exam normal       Cardiovascular Rhythm:regular Rate:Normal     Neuro/Psych negative neurological ROS  negative psych ROS   GI/Hepatic negative GI ROS, Neg liver ROS,   Endo/Other  negative endocrine ROS  Renal/GU negative Renal ROS  Female GU complaint     Musculoskeletal   Abdominal   Peds  Hematology  (+) anemia , Starting hgb 9.9   Anesthesia Other Findings   Reproductive/Obstetrics negative OB ROS                           Anesthesia Physical Anesthesia Plan  ASA: II  Anesthesia Plan: General ETT   Post-op Pain Management:    Induction:   Airway Management Planned:   Additional Equipment:   Intra-op Plan:   Post-operative Plan:   Informed Consent: I have reviewed the patients History and Physical, chart, labs and discussed the procedure including the risks, benefits and alternatives for the proposed anesthesia with the patient or authorized representative who has indicated his/her understanding and acceptance.   Dental Advisory Given  Plan Discussed with: CRNA and Surgeon  Anesthesia Plan Comments:         Anesthesia Quick Evaluation

## 2013-01-31 NOTE — Transfer of Care (Signed)
Immediate Anesthesia Transfer of Care Note  Patient: Julia Franklin  Procedure(s) Performed: Procedure(s): ABDOMINAL MYOMECTOMY (N/A)  Patient Location: PACU  Anesthesia Type:General  Level of Consciousness: awake, sedated and patient cooperative  Airway & Oxygen Therapy: Patient Spontanous Breathing and Patient connected to nasal cannula oxygen  Post-op Assessment: Report given to PACU RN and Post -op Vital signs reviewed and stable  Post vital signs: Reviewed and stable  Complications: No apparent anesthesia complications

## 2013-01-31 NOTE — Anesthesia Postprocedure Evaluation (Addendum)
  Anesthesia Post-op Note  Patient: Julia Franklin  Procedure(s) Performed: Procedure(s): ABDOMINAL MYOMECTOMY (N/A)  Patient Location: PACU  Anesthesia Type:General  Level of Consciousness: awake, alert  and oriented  Airway and Oxygen Therapy: Patient Spontanous Breathing  Post-op Pain: mild  Post-op Assessment: Post-op Vital signs reviewed, Patient's Cardiovascular Status Stable, Respiratory Function Stable, Patent Airway, No signs of Nausea or vomiting and Pain level controlled  Post-op Vital Signs: Reviewed and stable  Complications: No apparent anesthesia complications

## 2013-01-31 NOTE — Progress Notes (Signed)
UR completed 

## 2013-02-01 ENCOUNTER — Encounter (HOSPITAL_COMMUNITY): Payer: Self-pay | Admitting: Obstetrics

## 2013-02-01 LAB — CBC
HCT: 23.2 % — ABNORMAL LOW (ref 36.0–46.0)
MCH: 23.3 pg — ABNORMAL LOW (ref 26.0–34.0)
MCHC: 31.9 g/dL (ref 30.0–36.0)
RBC: 3.18 MIL/uL — ABNORMAL LOW (ref 3.87–5.11)
RDW: 17 % — ABNORMAL HIGH (ref 11.5–15.5)

## 2013-02-01 NOTE — Progress Notes (Signed)
Patient ID: Julia Franklin, female   DOB: 1975-08-17, 37 y.o.   MRN: [id number] Postop day 1 Vital signs normal Good bowel sounds in dressing dry legs negative doing well

## 2013-02-02 NOTE — Progress Notes (Signed)
Patient ID: Julia Franklin, female   DOB: Nov 12, 1975, 37 y.o.   MRN: [id number] Postop day 2 Vital signs normal Dressing dry Passing flatus legs negative home today

## 2013-02-02 NOTE — Progress Notes (Signed)
Pt. Is discharged in the care of friend . Downstairs per ambulatory. Spirits are good. Denies any pain or discomfort. States her pain is better now. Discharge Rx were given to pt. Questions were asked and answered. States feels better now. Stable.

## 2013-02-02 NOTE — Discharge Summary (Signed)
  Patient was admitted because of multiple myomas and underwent multiple myomectomy on 10 1 postop she's done well abdomen soft incision clean and she's passing flatus she been discharged today on Percocet for pain to see me in 4 weeks

## 2013-04-12 ENCOUNTER — Emergency Department (INDEPENDENT_AMBULATORY_CARE_PROVIDER_SITE_OTHER)
Admission: EM | Admit: 2013-04-12 | Discharge: 2013-04-12 | Disposition: A | Discharge status: Home / Self Care | Payer: BC Managed Care – PPO | Source: Home / Self Care

## 2013-04-12 ENCOUNTER — Encounter (HOSPITAL_COMMUNITY): Payer: Self-pay | Admitting: Emergency Medicine

## 2013-04-12 DIAGNOSIS — M25569 Pain in unspecified knee: Secondary | ICD-10-CM

## 2013-04-12 DIAGNOSIS — M76891 Other specified enthesopathies of right lower limb, excluding foot: Secondary | ICD-10-CM

## 2013-04-12 DIAGNOSIS — M6580 Other synovitis and tenosynovitis, unspecified site: Secondary | ICD-10-CM

## 2013-04-12 DIAGNOSIS — M222X1 Patellofemoral disorders, right knee: Secondary | ICD-10-CM

## 2013-04-12 DIAGNOSIS — S83411A Sprain of medial collateral ligament of right knee, initial encounter: Secondary | ICD-10-CM

## 2013-04-12 DIAGNOSIS — S83419A Sprain of medial collateral ligament of unspecified knee, initial encounter: Secondary | ICD-10-CM

## 2013-04-12 MED ORDER — TRAMADOL HCL 50 MG PO TABS: 50.0000 mg | ORAL_TABLET | Freq: Four times a day (QID) | ORAL | Status: DC | PRN

## 2013-04-12 MED ORDER — DICLOFENAC SODIUM 1 % TD GEL: 1.0000 "application " | Freq: Four times a day (QID) | TRANSDERMAL | Status: DC

## 2013-04-12 NOTE — ED Provider Notes (Signed)
CSN: [id number]     Arrival date & time 04/12/13  1143 History   First MD Initiated Contact with Patient 04/12/13 1201     Chief Complaint  Patient presents with  . Knee Pain   (Consider location/radiation/quality/duration/timing/severity/associated sxs/prior Treatment) HPI Comments: 3 months ago a dog ran into the pt's R leg striking the posterior and lateral aspect of the knee. She had pain at the time but got better after resting for a period of time s/p recovery from myomectomy. When she started back to work recently the R leg began to hurt again. C/O pain the length of the leg from hip to feet.    Past Medical History  Diagnosis Date  . Anxiety   . Fibroids   . Asthma   . Headache(784.0)     migraines  . Anemia    Past Surgical History  Procedure Laterality Date  . Knee surgery      left  ACL  . Cesarean section  1993  . Urethral dilation    . Myomectomy N/A 01/31/2013    Procedure: ABDOMINAL MYOMECTOMY;  Surgeon: Kathreen Cosier, MD;  Location: [facility] ORS;  Service: Gynecology;  Laterality: N/A;   Family History  Problem Relation Age of Onset  . Hypertension Mother   . Stroke Mother   . Diabetes Father   . Stroke Father   . Parkinson's disease Maternal Grandmother   . Stroke Paternal Grandmother   . Cancer Sister     lung  . Cancer Brother     lung   History  Substance Use Topics  . Smoking status: Never Smoker   . Smokeless tobacco: Not on file  . Alcohol Use: Yes     Comment: socially   OB History   Grav Para Term Preterm Abortions TAB SAB Ect Mult Living                 Review of Systems  Constitutional: Negative for fever, chills and activity change.  HENT: Negative.   Respiratory: Negative.   Cardiovascular: Negative.   Musculoskeletal: Negative for back pain and joint swelling.       As per HPI  Skin: Negative for color change, pallor and rash.  Neurological: Negative.     Allergies  Penicillins  Home Medications   Current Outpatient Rx   Name  Route  Sig  Dispense  Refill  . diclofenac sodium (VOLTAREN) 1 % GEL   Topical   Apply 1 application topically 4 (four) times daily.   100 g   0   . ferrous sulfate (IRON SUPPLEMENT) 325 (65 FE) MG tablet   Oral   Take 325 mg by mouth daily with breakfast.         . traMADol (ULTRAM) 50 MG tablet   Oral   Take 1 tablet (50 mg total) by mouth every 6 (six) hours as needed.   15 tablet   0    BP 121/73  Pulse 70  Temp(Src) 98.3 F (36.8 C) (Oral)  Resp 18  Ht 5\' 3"  (1.6 m)  Wt 185 lb (83.915 kg)  BMI 32.78 kg/m2  SpO2 99% Physical Exam  Nursing note and vitals reviewed. Constitutional: She is oriented to person, place, and time. She appears well-developed and well-nourished. No distress.  HENT:  Head: Normocephalic and atraumatic.  Eyes: EOM are normal.  Neck: Normal range of motion. Neck supple.  Pulmonary/Chest: Effort normal. No respiratory distress.  Musculoskeletal: She exhibits no edema.  No right hip  tenderness. Full range of motion of the right hip. Abduction and abduction intact without pain. Right knee exam reveals no discoloration, swelling or deformity. There is tenderness over the patellofemoral ligament as well as the medial hamstring ligament and over the medial collateral ligament. Full range of motion of the knee. Negative drawer, negative varus, and negative valgus, however, there is mild pain along the medial aspect elicited with valgus maneuver. Full flexion and extension. Normal exam of the tib-fib ankle and feet. Pedal pulse 2+. Distal neurovascular motor sensory is intact.  Neurological: She is alert and oriented to person, place, and time. No cranial nerve deficit.  Skin: Skin is warm and dry.  Psychiatric: She has a normal mood and affect.    ED Course  Procedures (including critical care time) Labs Review Labs Reviewed - No data to display Imaging Review No results found.    MDM   1. Patellofemoral syndrome, right   2.  Tendinitis of right knee   3. Sprain and strain of medial collateral ligament of knee, right, initial encounter      Short knee immobilizer as directed. Use only with prolonged standing and walking. Do not wear at night. Ice Knee extension movements as directed F/U with ortho above in 1 week if no improvement.   Hayden Rasmussen, NP 04/12/13 1236

## 2013-04-13 NOTE — ED Provider Notes (Signed)
Medical screening examination/treatment/procedure(s) were performed by a resident physician or non-physician practitioner and as the supervising physician I was immediately available for consultation/collaboration.  Jatara Huettner, MD    Issac Moure S Kyndle Schlender, MD 04/13/13 0751 

## 2014-03-17 ENCOUNTER — Ambulatory Visit (INDEPENDENT_AMBULATORY_CARE_PROVIDER_SITE_OTHER): Payer: BC Managed Care – PPO | Admitting: Family Medicine

## 2014-03-17 VITALS — BP 129/78 | HR 86 | Temp 98.4°F | Resp 16 | Ht 63.0 in | Wt 182.8 lb

## 2014-03-17 DIAGNOSIS — R05 Cough: Secondary | ICD-10-CM

## 2014-03-17 DIAGNOSIS — J0110 Acute frontal sinusitis, unspecified: Secondary | ICD-10-CM

## 2014-03-17 DIAGNOSIS — R059 Cough, unspecified: Secondary | ICD-10-CM

## 2014-03-17 MED ORDER — AMOXICILLIN-POT CLAVULANATE 875-125 MG PO TABS: 1.0000 | ORAL_TABLET | Freq: Two times a day (BID) | ORAL | Status: DC

## 2014-03-17 MED ORDER — BENZONATATE 200 MG PO CAPS: 200.0000 mg | ORAL_CAPSULE | Freq: Three times a day (TID) | ORAL | Status: DC | PRN

## 2014-03-17 MED ORDER — ALBUTEROL SULFATE (2.5 MG/3ML) 0.083% IN NEBU
2.5000 mg | INHALATION_SOLUTION | Freq: Once | RESPIRATORY_TRACT | Status: AC
  Administered 2014-03-17: 2.5 mg via RESPIRATORY_TRACT

## 2014-03-17 MED ORDER — HYDROCOD POLST-CHLORPHEN POLST 10-8 MG/5ML PO LQCR: 5.0000 mL | Freq: Two times a day (BID) | ORAL | Status: DC | PRN

## 2014-03-17 MED ORDER — ALBUTEROL SULFATE 108 (90 BASE) MCG/ACT IN AEPB: 2.0000 | INHALATION_SPRAY | RESPIRATORY_TRACT | Status: DC | PRN

## 2014-03-17 NOTE — Progress Notes (Signed)
Subjective:    Patient ID: Julia Franklin, female    DOB: [birth year], 38 y.o.   MRN: [id number] This chart was scribed for Delman Cheadle, MD by Martinique Peace, ED Scribe. The patient was seen in [facility]08. The patient's care was started at 4:03 PM.  Chief Complaint  Patient presents with  . Sore Throat    cough x 1 month.  thick sputum.  sore throat x 1 month.  also has a knot on her abd that came from all the coughing.    . Dizziness    HPI HPI Comments: Julia Franklin is a 38 y.o. female who presents to the [facility] complaining of sore throat with associated nasal congestion, productive cough, SOB, dizziness, fever, and chills. Pt reported some initial sinus pressure that has now subsided. She notes some blood when blowing her nose yesterday. She has tried using Dayquil, Nightquil, and Robotussin without much relief. Pt is non-smoker. Pt was seen here previously for same symptoms but denies much improvement since onset over 1 month ago.   Pt also complains of unusual mass to right side of abdomen onset 2 months ago. She now reports some tenderness to affected area with applied pressure over the past 2 weeks. Pt denies history of problems with gallbladder. She adds that she is going to come here to [facility] to have complete physical with labs performed within the next few weeks.    Past Medical History  Diagnosis Date  . Anxiety   . Fibroids   . Asthma   . Headache(784.0)     migraines  . Anemia    Current Outpatient Prescriptions on File Prior to Visit  Medication Sig Dispense Refill  . traMADol (ULTRAM) 50 MG tablet Take 1 tablet (50 mg total) by mouth every 6 (six) hours as needed. 15 tablet 0   No current facility-administered medications on file prior to visit.   Allergies  Allergen Reactions  . Penicillins     Unknown reaction in childhood      Review of Systems  Constitutional: Positive for fever and chills.  HENT: Positive for congestion and sore throat. Negative for sinus pressure.     Respiratory: Positive for cough and shortness of breath.   Gastrointestinal:       Small unusual mass to right side of abdomen.   Neurological: Positive for dizziness.       Objective:  BP 129/78 mmHg  Pulse 86  Temp(Src) 98.4 F (36.9 C) (Oral)  Resp 16  Ht 5\' 3"  (1.6 m)  Wt 182 lb 12.8 oz (82.918 kg)  BMI 32.39 kg/m2  SpO2 100%  LMP 02/23/2014  Physical Exam  Constitutional: She is oriented to person, place, and time. She appears well-developed and well-nourished. No distress.  HENT:  Head: Normocephalic and atraumatic.  Right Ear: Tympanic membrane normal.  Left Ear: Tympanic membrane normal.  Nose: Rhinorrhea present.  Mouth/Throat: Oropharynx is clear and moist.  Eyes: Conjunctivae and EOM are normal.  Neck: Neck supple. No tracheal deviation present. No thyroid mass and no thyromegaly present.  Cardiovascular: Normal rate, regular rhythm and normal heart sounds.  Exam reveals no gallop.   No murmur heard. Pulmonary/Chest: Effort normal. No respiratory distress.  Lungs clear, decreased air movement throughout.   Abdominal: There is negative Murphy's sign.  Musculoskeletal: Normal range of motion.  Lymphadenopathy:    She has no cervical adenopathy.  Neurological: She is alert and oriented to person, place, and time.  Skin: Skin is warm and dry.  Psychiatric: She has a normal mood and affect. Her behavior is normal.  Nursing note and vitals reviewed.         Assessment & Plan:   Acute frontal sinusitis, recurrence not specified  Cough - Plan: albuterol (PROVENTIL) (2.5 MG/3ML) 0.083% nebulizer solution 2.5 mg  Meds ordered this encounter  Medications  . amoxicillin-clavulanate (AUGMENTIN) 875-125 MG per tablet    Sig: Take 1 tablet by mouth 2 (two) times daily.    Dispense:  28 tablet    Refill:  0  . chlorpheniramine-HYDROcodone (TUSSIONEX PENNKINETIC ER) 10-8 MG/5ML LQCR    Sig: Take 5 mLs by mouth every 12 (twelve) hours as needed.    Dispense:   120 mL    Refill:  0  . benzonatate (TESSALON) 200 MG capsule    Sig: Take 1 capsule (200 mg total) by mouth 3 (three) times daily as needed for cough.    Dispense:  40 capsule    Refill:  0  . albuterol (PROVENTIL) (2.5 MG/3ML) 0.083% nebulizer solution 2.5 mg    Sig:   . Albuterol Sulfate (PROAIR RESPICLICK) 329 (90 BASE) MCG/ACT AEPB    Sig: Inhale 2 puffs into the lungs every 4 (four) hours as needed (shortness of breath, cough, wheeze).    Dispense:  1 each    Refill:  1    I personally performed the services described in this documentation, which was scribed in my presence. The recorded information has been reviewed and considered, and addended by me as needed.  Delman Cheadle, MD MPH

## 2014-03-17 NOTE — Patient Instructions (Signed)
Sinusitis Sinusitis is redness, soreness, and inflammation of the paranasal sinuses. Paranasal sinuses are air pockets within the bones of your face (beneath the eyes, the middle of the forehead, or above the eyes). In healthy paranasal sinuses, mucus is able to drain out, and air is able to circulate through them by way of your nose. However, when your paranasal sinuses are inflamed, mucus and air can become trapped. This can allow bacteria and other germs to grow and cause infection. Sinusitis can develop quickly and last only a short time (acute) or continue over a long period (chronic). Sinusitis that lasts for more than 12 weeks is considered chronic.  CAUSES  Causes of sinusitis include:  Allergies.  Structural abnormalities, such as displacement of the cartilage that separates your nostrils (deviated septum), which can decrease the air flow through your nose and sinuses and affect sinus drainage.  Functional abnormalities, such as when the small hairs (cilia) that line your sinuses and help remove mucus do not work properly or are not present. SIGNS AND SYMPTOMS  Symptoms of acute and chronic sinusitis are the same. The primary symptoms are pain and pressure around the affected sinuses. Other symptoms include:  Upper toothache.  Earache.  Headache.  Bad breath.  Decreased sense of smell and taste.  A cough, which worsens when you are lying flat.  Fatigue.  Fever.  Thick drainage from your nose, which often is green and may contain pus (purulent).  Swelling and warmth over the affected sinuses. DIAGNOSIS  Your health care provider will perform a physical exam. During the exam, your health care provider may:  Look in your nose for signs of abnormal growths in your nostrils (nasal polyps).  Tap over the affected sinus to check for signs of infection.  View the inside of your sinuses (endoscopy) using an imaging device that has a light attached (endoscope). If your health  care provider suspects that you have chronic sinusitis, one or more of the following tests may be recommended:  Allergy tests.  Nasal culture. A sample of mucus is taken from your nose, sent to a lab, and screened for bacteria.  Nasal cytology. A sample of mucus is taken from your nose and examined by your health care provider to determine if your sinusitis is related to an allergy. TREATMENT  Most cases of acute sinusitis are related to a viral infection and will resolve on their own within 10 days. Sometimes medicines are prescribed to help relieve symptoms (pain medicine, decongestants, nasal steroid sprays, or saline sprays).  However, for sinusitis related to a bacterial infection, your health care provider will prescribe antibiotic medicines. These are medicines that will help kill the bacteria causing the infection.  Rarely, sinusitis is caused by a fungal infection. In theses cases, your health care provider will prescribe antifungal medicine. For some cases of chronic sinusitis, surgery is needed. Generally, these are cases in which sinusitis recurs more than 3 times per year, despite other treatments. HOME CARE INSTRUCTIONS   Drink plenty of water. Water helps thin the mucus so your sinuses can drain more easily.  Use a humidifier.  Inhale steam 3 to 4 times a day (for example, sit in the bathroom with the shower running).  Apply a warm, moist washcloth to your face 3 to 4 times a day, or as directed by your health care provider.  Use saline nasal sprays to help moisten and clean your sinuses.  Take medicines only as directed by your health care provider.    If you were prescribed either an antibiotic or antifungal medicine, finish it all even if you start to feel better. SEEK IMMEDIATE MEDICAL CARE IF:  You have increasing pain or severe headaches.  You have nausea, vomiting, or drowsiness.  You have swelling around your face.  You have vision problems.  You have a stiff  neck.  You have difficulty breathing. MAKE SURE YOU:   Understand these instructions.  Will watch your condition.  Will get help right away if you are not doing well or get worse. Document Released: 04/19/2005 Document Revised: 09/03/2013 Document Reviewed: 05/04/2011 [employer] Patient Information 2015 [place], [place]. This information is not intended to replace advice given to you by your health care provider. Make sure you discuss any questions you have with your health care provider. Hernia A hernia occurs when an internal organ pushes out through a weak spot in the abdominal wall. Hernias most commonly occur in the groin and around the navel. Hernias often can be pushed back into place (reduced). Most hernias tend to get worse over time. Some abdominal hernias can get stuck in the opening (irreducible or incarcerated hernia) and cannot be reduced. An irreducible abdominal hernia which is tightly squeezed into the opening is at risk for impaired blood supply (strangulated hernia). A strangulated hernia is a medical emergency. Because of the risk for an irreducible or strangulated hernia, surgery may be recommended to repair a hernia. CAUSES   Heavy lifting.  Prolonged coughing.  Straining to have a bowel movement.  A cut (incision) made during an abdominal surgery. HOME CARE INSTRUCTIONS   Bed rest is not required. You may continue your normal activities.  Avoid lifting more than 10 pounds (4.5 kg) or straining.  Cough gently. If you are a smoker it is best to stop. Even the best hernia repair can break down with the continual strain of coughing. Even if you do not have your hernia repaired, a cough will continue to aggravate the problem.  Do not wear anything tight over your hernia. Do not try to keep it in with an outside bandage or truss. These can damage abdominal contents if they are trapped within the hernia sac.  Eat a normal diet.  Avoid constipation. Straining over long  periods of time will increase hernia size and encourage breakdown of repairs. If you cannot do this with diet alone, stool softeners may be used. SEEK IMMEDIATE MEDICAL CARE IF:   You have a fever.  You develop increasing abdominal pain.  You feel nauseous or vomit.  Your hernia is stuck outside the abdomen, looks discolored, feels hard, or is tender.  You have any changes in your bowel habits or in the hernia that are unusual for you.  You have increased pain or swelling around the hernia.  You cannot push the hernia back in place by applying gentle pressure while lying down. MAKE SURE YOU:   Understand these instructions.  Will watch your condition.  Will get help right away if you are not doing well or get worse. Document Released: 04/19/2005 Document Revised: 07/12/2011 Document Reviewed: 12/07/2007 [facility] Patient Information 2015 [place], [place]. This information is not intended to replace advice given to you by your health care provider. Make sure you discuss any questions you have with your health care provider. Lipoma A lipoma is a noncancerous (benign) tumor composed of fat cells. They are usually found under the skin (subcutaneous). A lipoma may occur in any tissue of the body that contains fat. Common areas  for lipomas to appear include the back, shoulders, buttocks, and thighs. Lipomas are a very common soft tissue growth. They are soft and grow slowly. Most problems caused by a lipoma depend on where it is growing. DIAGNOSIS  A lipoma can be diagnosed with a physical exam. These tumors rarely become cancerous, but radiographic studies can help determine this for certain. Studies used may include:  Computerized X-ray scans (CT or CAT scan).  Computerized magnetic scans (MRI). TREATMENT  Small lipomas that are not causing problems may be watched. If a lipoma continues to enlarge or causes problems, removal is often the best treatment. Lipomas can also be removed to  improve appearance. Surgery is done to remove the fatty cells and the surrounding capsule. Most often, this is done with medicine that numbs the area (local anesthetic). The removed tissue is examined under a microscope to make sure it is not cancerous. Keep all follow-up appointments with your caregiver. SEEK MEDICAL CARE IF:   The lipoma becomes larger or hard.  The lipoma becomes painful, red, or increasingly swollen. These could be signs of infection or a more serious condition. Document Released: 04/09/2002 Document Revised: 07/12/2011 Document Reviewed: 09/19/2009 [facility] Patient Information 2015 [place], [place]. This information is not intended to replace advice given to you by your health care provider. Make sure you discuss any questions you have with your health care provider.

## 2014-04-02 ENCOUNTER — Ambulatory Visit (INDEPENDENT_AMBULATORY_CARE_PROVIDER_SITE_OTHER): Payer: BC Managed Care – PPO | Admitting: Family Medicine

## 2014-04-02 VITALS — BP 133/76 | HR 77 | Temp 98.2°F | Resp 18 | Ht 63.0 in | Wt 183.0 lb

## 2014-04-02 DIAGNOSIS — Z1329 Encounter for screening for other suspected endocrine disorder: Secondary | ICD-10-CM

## 2014-04-02 DIAGNOSIS — Z2821 Immunization not carried out because of patient refusal: Secondary | ICD-10-CM

## 2014-04-02 DIAGNOSIS — Z131 Encounter for screening for diabetes mellitus: Secondary | ICD-10-CM

## 2014-04-02 DIAGNOSIS — Z13220 Encounter for screening for lipoid disorders: Secondary | ICD-10-CM

## 2014-04-02 DIAGNOSIS — R202 Paresthesia of skin: Secondary | ICD-10-CM

## 2014-04-02 DIAGNOSIS — Z0000 Encounter for general adult medical examination without abnormal findings: Secondary | ICD-10-CM

## 2014-04-02 DIAGNOSIS — Z130 Encounter for screening for diseases of the blood and blood-forming organs and certain disorders involving the immune mechanism: Secondary | ICD-10-CM

## 2014-04-02 LAB — POCT CBC
Granulocyte percent: 57 %G (ref 37–80)
HCT, POC: 34.2 % — AB (ref 37.7–47.9)
Hemoglobin: 10.9 g/dL — AB (ref 12.2–16.2)
Lymph, poc: 2.7 (ref 0.6–3.4)
MCH, POC: 26.1 pg — AB (ref 27–31.2)
MCHC: 32 g/dL (ref 31.8–35.4)
MCV: 81.6 fL (ref 80–97)
MID (cbc): 0.5 (ref 0–0.9)
MPV: 7.9 fL (ref 0–99.8)
POC Granulocyte: 4.2 (ref 2–6.9)
POC LYMPH PERCENT: 36.7 %L (ref 10–50)
POC MID %: 6.3 %M (ref 0–12)
Platelet Count, POC: 293 10*3/uL (ref 142–424)
RBC: 4.19 M/uL (ref 4.04–5.48)
RDW, POC: 15.7 %
WBC: 7.3 10*3/uL (ref 4.6–10.2)

## 2014-04-02 LAB — POCT GLYCOSYLATED HEMOGLOBIN (HGB A1C): Hemoglobin A1C: 5.6

## 2014-04-02 MED ORDER — CENTRUM PO CHEW: 1.0000 | CHEWABLE_TABLET | Freq: Every day | ORAL | Status: DC

## 2014-04-02 NOTE — Progress Notes (Signed)
 Chief Complaint:  Chief Complaint  Patient presents with  . Annual Exam    HPI: Julia Franklin is a 38 y.o. female who is here for annual  Last pap was January 2015 Has possibel right mass of breast from 05/2012 mammogram, she has a f/u to recheck that She has eaten, she ate 2 hours ago Doing well overall G1L1 Menarche 7 , normal flow, 5 days, heavy clots and cramps LMP yesterday , has ahd mycomectomy and was netter and now regressing.  She declines flu vaccine She had her tetanus vaccine 9 years, will wait until next year to get it.  Mood is well, has let go of some job responsibilities Dental exam-along time ago Eye exam-03/2013 Numbness and tingling in left hand, in leg, random, ually when she is sittign and reading  Wt Readings from Last 3 Encounters:  04/02/14 183 lb (83.008 kg)  03/17/14 182 lb 12.8 oz (82.918 kg)  04/12/13 185 lb (83.915 kg)       Past Medical History  Diagnosis Date  . Anxiety   . Fibroids   . Asthma   . Headache(784.0)     migraines  . Anemia   . Depression    Past Surgical History  Procedure Laterality Date  . Knee surgery      left  ACL  . Cesarean section  1993  . Urethral dilation    . Myomectomy N/A 01/31/2013    Procedure: ABDOMINAL MYOMECTOMY;  Surgeon: Frederico Hamman, MD;  Location: [facility];  Service: Gynecology;  Laterality: N/A;   History   Social History  . Marital Status: Married    Spouse Name: N/A    Number of Children: N/A  . Years of Education: N/A   Social History Main Topics  . Smoking status: Never Smoker   . Smokeless tobacco: None  . Alcohol Use: 0.0 oz/week    0 Not specified per week     Comment: socially  . Drug Use: No  . Sexual Activity: Yes     Comment: number of sex partners in the last 35 months  1   Other Topics Concern  . None   Social History Narrative   Family History  Problem Relation Age of Onset  . Hypertension Mother   . Stroke Mother   . Diabetes Father   . Stroke  Father   . Parkinson's disease Maternal Grandmother   . Stroke Paternal Grandmother   . Cancer Sister     lung  . Cancer Brother     lung   Allergies  Allergen Reactions  . Penicillins     Unknown reaction in childhood   Prior to Admission medications   Medication Sig Start Date End Date Taking? Authorizing Provider  Albuterol Sulfate (PROAIR RESPICLICK) 335 (90 BASE) MCG/ACT AEPB Inhale 2 puffs into the lungs every 4 (four) hours as needed (shortness of breath, cough, wheeze). 03/17/14  Yes Shawnee Knapp, MD  amoxicillin-clavulanate (AUGMENTIN) 875-125 MG per tablet Take 1 tablet by mouth 2 (two) times daily. 03/17/14  Yes Shawnee Knapp, MD  benzonatate (TESSALON) 200 MG capsule Take 1 capsule (200 mg total) by mouth 3 (three) times daily as needed for cough. 03/17/14  Yes Shawnee Knapp, MD  chlorpheniramine-HYDROcodone [facility] PENNKINETIC ER) 10-8 MG/5ML LQCR Take 5 mLs by mouth every 12 (twelve) hours as needed. 03/17/14  Yes Shawnee Knapp, MD  traMADol (ULTRAM) 50 MG tablet Take 1 tablet (50 mg total) by mouth  every 6 (six) hours as needed. Patient not taking: Reported on 04/02/2014 04/12/13   Janne Napoleon, NP     ROS: The patient denies fevers, chills, night sweats, unintentional weight loss, chest pain, palpitations, wheezing, dyspnea on exertion, nausea, vomiting, abdominal pain, dysuria, hematuria, melena, + numbness, weakness, or tingling.   All other systems have been reviewed and were otherwise negative with the exception of those mentioned in the HPI and as above.    PHYSICAL EXAM: Filed Vitals:   04/02/14 1619  BP: 133/76  Pulse: 77  Temp: 98.2 F (36.8 C)  Resp: 18   Filed Vitals:   04/02/14 1619  Height: 5\' 3"  (1.6 m)  Weight: 183 lb (83.008 kg)   Body mass index is 32.43 kg/(m^2).  General: Alert, no acute distress HEENT:  Normocephalic, atraumatic, oropharynx patent. EOMI, PERRLA. Tm nl, no exudates, no thyroidmegaly Cardiovascular:  Regular rate and rhythm, no rubs  murmurs or gallops.  No Carotid bruits, radial pulse intact. No pedal edema.  Respiratory: Clear to auscultation bilaterally.  No wheezes, rales, or rhonchi.  No cyanosis, no use of accessory musculature GI: No organomegaly, abdomen is soft and non-tender, positive bowel sounds.  No masses. Skin: No rashes. Neurologic: Facial musculature symmetric. Psychiatric: Patient is appropriate throughout our interaction. Lymphatic: No cervical lymphadenopathy Musculoskeletal: Gait intact. 5/5 strength, 2/2 DTRs Neg spurling Normal shoulder Neg tinel Full wrist strength   LABS: Results for orders placed or performed in visit on 04/02/14  POCT CBC  Result Value Ref Range   WBC 7.3 4.6 - 10.2 K/uL   Lymph, poc 2.7 0.6 - 3.4   POC LYMPH PERCENT 36.7 10 - 50 %L   MID (cbc) 0.5 0 - 0.9   POC MID % 6.3 0 - 12 %M   POC Granulocyte 4.2 2 - 6.9   Granulocyte percent 57.0 37 - 80 %G   RBC 4.19 4.04 - 5.48 M/uL   Hemoglobin 10.9 (A) 12.2 - 16.2 g/dL   HCT, POC 34.2 (A) 37.7 - 47.9 %   MCV 81.6 80 - 97 fL   MCH, POC 26.1 (A) 27 - 31.2 pg   MCHC 32.0 31.8 - 35.4 g/dL   RDW, POC 15.7 %   Platelet Count, POC 293 142 - 424 K/uL   MPV 7.9 0 - 99.8 fL  POCT glycosylated hemoglobin (Hb A1C)  Result Value Ref Range   Hemoglobin A1C 5.6      EKG/XRAY:   Primary read interpreted by Dr. Marin Comment at [facility].   ASSESSMENT/PLAN: Encounter Diagnoses  Name Primary?  . Annual physical exam Yes  . Screening for deficiency anemia   . Screening for hypothyroidism   . Screening for hyperlipidemia   . Screening for diabetes mellitus (DM)   . Paresthesia   . Influenza vaccination declined    Annual labs pending along with nicotine level which is required for her job to give her a decrease in her insurance premium Pap and mammo through ob/gyn She has eaten so this is not fasting but the for m requires a lipid panel F/u prn otherwise in 1 year   Gross sideeffects, risk and benefits, and alternatives of  medications d/w patient. Patient is aware that all medications have potential sideeffects and we are unable to predict every sideeffect or drug-drug interaction that may occur.  , [place], DO 04/02/2014 5:29 PM

## 2014-04-03 LAB — LIPID PANEL
Cholesterol: 206 mg/dL — ABNORMAL HIGH (ref 0–200)
HDL: 48 mg/dL (ref >39)
LDL Cholesterol: 112 mg/dL — ABNORMAL HIGH (ref 0–99)
Total CHOL/HDL Ratio: 4.3 Ratio
Triglycerides: 229 mg/dL — ABNORMAL HIGH (ref <150)
VLDL: 46 mg/dL — ABNORMAL HIGH (ref 0–40)

## 2014-04-03 LAB — COMPLETE METABOLIC PANEL WITHOUT GFR
ALT: <8 U/L (ref 0–35)
Calcium: 9.1 mg/dL (ref 8.4–10.5)
Chloride: 105 meq/L (ref 96–112)
GFR, Est African American: >89 mL/min
GFR, Est Non African American: >89 mL/min
Glucose, Bld: 86 mg/dL (ref 70–99)
Sodium: 141 meq/L (ref 135–145)
Total Protein: 6.9 g/dL (ref 6.0–8.3)

## 2014-04-03 LAB — COMPLETE METABOLIC PANEL WITH GFR
AST: 16 U/L (ref 0–37)
Albumin: 3.9 g/dL (ref 3.5–5.2)
Alkaline Phosphatase: 48 U/L (ref 39–117)
BUN: 12 mg/dL (ref 6–23)
CO2: 27 mEq/L (ref 19–32)
Creat: 0.59 mg/dL (ref 0.50–1.10)
Potassium: 4.5 mEq/L (ref 3.5–5.3)
Total Bilirubin: 0.3 mg/dL (ref 0.2–1.2)

## 2014-04-03 LAB — TSH: TSH: 1.13 u[IU]/mL (ref 0.350–4.500)

## 2014-04-04 ENCOUNTER — Other Ambulatory Visit (HOSPITAL_COMMUNITY): Payer: Self-pay | Admitting: Obstetrics

## 2014-04-04 ENCOUNTER — Other Ambulatory Visit: Payer: Self-pay | Admitting: Obstetrics

## 2014-04-04 DIAGNOSIS — N6310 Unspecified lump in the right breast, unspecified quadrant: Secondary | ICD-10-CM

## 2014-04-04 DIAGNOSIS — Z1231 Encounter for screening mammogram for malignant neoplasm of breast: Secondary | ICD-10-CM

## 2014-04-04 LAB — NICOTINE/COTININE METABOLITES: Cotinine: <10 ng/mL

## 2014-04-09 ENCOUNTER — Telehealth: Payer: Self-pay | Admitting: Family Medicine

## 2014-04-09 NOTE — Telephone Encounter (Signed)
LM that I can't find form to fill out for results of labs. She can drop a copy of the original that I filled out and had given back to her on the last OV. I will fill out the rest and fax it to the company. LM about labs as well. Lipids were inaccurate because she had eaten.

## 2014-04-10 ENCOUNTER — Telehealth: Payer: Self-pay | Admitting: Family Medicine

## 2014-04-10 NOTE — Telephone Encounter (Signed)
Need her to drop off form for me to fill out, can't find it.

## 2014-04-12 ENCOUNTER — Ambulatory Visit
Admission: RE | Admit: 2014-04-12 | Discharge: 2014-04-12 | Disposition: A | Discharge status: Home / Self Care | Payer: BC Managed Care – PPO | Source: Ambulatory Visit | Attending: Obstetrics | Admitting: Obstetrics

## 2014-04-12 ENCOUNTER — Other Ambulatory Visit: Payer: Self-pay | Admitting: Obstetrics

## 2014-04-12 DIAGNOSIS — N6310 Unspecified lump in the right breast, unspecified quadrant: Secondary | ICD-10-CM

## 2014-08-17 ENCOUNTER — Encounter: Payer: Self-pay | Admitting: Physician Assistant

## 2015-02-05 ENCOUNTER — Other Ambulatory Visit (HOSPITAL_COMMUNITY): Payer: Self-pay | Admitting: Obstetrics

## 2015-02-05 ENCOUNTER — Other Ambulatory Visit: Payer: Self-pay

## 2015-02-05 DIAGNOSIS — Z1231 Encounter for screening mammogram for malignant neoplasm of breast: Secondary | ICD-10-CM

## 2015-02-05 DIAGNOSIS — D259 Leiomyoma of uterus, unspecified: Secondary | ICD-10-CM

## 2015-02-05 LAB — PROCEDURE REPORT - SCANNED: Pap: NEGATIVE

## 2015-02-11 ENCOUNTER — Ambulatory Visit (HOSPITAL_COMMUNITY): Admission: RE | Admit: 2015-02-11 | Payer: BLUE CROSS/BLUE SHIELD | Source: Ambulatory Visit

## 2015-02-11 ENCOUNTER — Ambulatory Visit: Payer: Self-pay

## 2015-04-17 ENCOUNTER — Ambulatory Visit
Admission: RE | Admit: 2015-04-17 | Discharge: 2015-04-17 | Disposition: A | Discharge status: Home / Self Care | Payer: BLUE CROSS/BLUE SHIELD | Source: Ambulatory Visit

## 2015-04-17 DIAGNOSIS — Z1231 Encounter for screening mammogram for malignant neoplasm of breast: Secondary | ICD-10-CM

## 2016-01-25 ENCOUNTER — Encounter: Payer: Self-pay | Admitting: *Deleted

## 2016-10-05 ENCOUNTER — Ambulatory Visit (HOSPITAL_COMMUNITY)
Admission: EM | Admit: 2016-10-05 | Discharge: 2016-10-05 | Disposition: A | Discharge status: Home / Self Care | Payer: BLUE CROSS/BLUE SHIELD | Attending: Internal Medicine | Admitting: Internal Medicine

## 2016-10-05 ENCOUNTER — Encounter (HOSPITAL_COMMUNITY): Payer: Self-pay | Admitting: Emergency Medicine

## 2016-10-05 DIAGNOSIS — T7840XA Allergy, unspecified, initial encounter: Secondary | ICD-10-CM | POA: Diagnosis not present

## 2016-10-05 DIAGNOSIS — R21 Rash and other nonspecific skin eruption: Secondary | ICD-10-CM

## 2016-10-05 MED ORDER — PREDNISONE 20 MG PO TABS
ORAL_TABLET | ORAL | Status: AC
  Filled 2016-10-05: qty 3

## 2016-10-05 MED ORDER — PREDNISONE 20 MG PO TABS: 60.0000 mg | ORAL_TABLET | Freq: Every day | ORAL | Status: DC

## 2016-10-05 MED ORDER — PREDNISONE 10 MG PO TABS: ORAL_TABLET | ORAL | 0 refills | Status: DC

## 2016-10-05 MED ORDER — PREDNISONE 20 MG PO TABS
60.0000 mg | ORAL_TABLET | Freq: Once | ORAL | Status: AC
Start: 2016-10-05 — End: 2016-10-05
  Administered 2016-10-05: 60 mg via ORAL

## 2016-10-05 NOTE — ED Triage Notes (Signed)
The patient presented to the [facility] with a complaint of a rash and itching that started this morning that she believed to be some type of allergic reaction. No new medications noted.

## 2016-10-05 NOTE — Discharge Instructions (Addendum)
See your Physician for recheck,  Benadryl 25 mg every 4 hours.

## 2016-10-05 NOTE — ED Provider Notes (Signed)
CSN: [id number]     Arrival date & time 10/05/16  1220 History   None    Chief Complaint  Patient presents with  . Rash   (Consider location/radiation/quality/duration/timing/severity/associated sxs/prior Treatment) The history is provided by the patient. No language interpreter was used.  Rash  Location:  Face and torso Facial rash location:  Face Quality: itchiness and redness   Severity:  Moderate Onset quality:  Gradual Duration:  1 day Timing:  Constant Progression:  Worsening Chronicity:  New Relieved by:  Nothing Worsened by:  Nothing Ineffective treatments:  None tried Associated symptoms: no sore throat     Past Medical History:  Diagnosis Date  . Anemia   . Anxiety   . Asthma   . Depression   . Fibroids   . Headache(784.0)    migraines   Past Surgical History:  Procedure Laterality Date  . CESAREAN SECTION  1993  . KNEE SURGERY     left  ACL  . MYOMECTOMY N/A 01/31/2013   Procedure: ABDOMINAL MYOMECTOMY;  Surgeon: Frederico Hamman, MD;  Location: [facility];  Service: Gynecology;  Laterality: N/A;  . URETHRAL DILATION     Family History  Problem Relation Age of Onset  . Hypertension Mother   . Stroke Mother   . Diabetes Father   . Stroke Father   . Parkinson's disease Maternal Grandmother   . Stroke Paternal Grandmother   . Cancer Sister        lung  . Cancer Brother        lung   Social History  Substance Use Topics  . Smoking status: Never Smoker  . Smokeless tobacco: Not on file  . Alcohol use 0.0 oz/week     Comment: socially   OB History    No data available     Review of Systems  HENT: Negative for sore throat.   Skin: Positive for rash.  All other systems reviewed and are negative.   Allergies  Penicillins  Home Medications   Prior to Admission medications   Medication Sig Start Date End Date Taking? Authorizing Provider  multivitamin-iron-minerals-folic acid (CENTRUM) chewable tablet Chew 1 tablet by mouth daily. 04/02/14   Yes Le, Thao P, DO  predniSONE (DELTASONE) 10 MG tablet 5,4,3,2,1 taper 10/05/16   Fransico Meadow, PA-C   Meds Ordered and Administered this Visit   Medications  predniSONE (DELTASONE) tablet 60 mg (not administered)    BP 132/62 (BP Location: Right Arm)   Pulse 73   Temp 98.2 F (36.8 C) (Oral)   Resp 16   SpO2 100%  No data found.   Physical Exam  Constitutional: She appears well-developed and well-nourished. No distress.  HENT:  Head: Normocephalic and atraumatic.  Eyes: Conjunctivae are normal.  Neck: Neck supple.  Cardiovascular: Normal rate and regular rhythm.   No murmur heard. Pulmonary/Chest: Effort normal and breath sounds normal. No respiratory distress.  Abdominal: Soft. There is no tenderness.  Musculoskeletal: She exhibits no edema.  Neurological: She is alert.  Skin: Skin is warm. Rash noted.  Erythema face, slight redness arms,   Psychiatric: She has a normal mood and affect.  Nursing note and vitals reviewed.   Urgent Care Course     Procedures (including critical care time)  Labs Review Labs Reviewed - No data to display  Imaging Review No results found.   Visual Acuity Review  Right Eye Distance:   Left Eye Distance:   Bilateral Distance:    Right Eye Near:  Left Eye Near:    Bilateral Near:         MDM Pt took benadryl.  Pt reports some relief.    1. Allergic reaction, initial encounter    Meds ordered this encounter  Medications  . predniSONE (DELTASONE) 10 MG tablet    Sig: 5,4,3,2,1 taper    Dispense:  15 tablet    Refill:  0    Order Specific Question:   Supervising Provider    Answer:   Sherlene Shams [[id number]]  . predniSONE (DELTASONE) tablet 60 mg   An After Visit Summary was printed and given to the patient.     Fransico Meadow, [place] 10/05/16 1512

## 2017-09-01 ENCOUNTER — Other Ambulatory Visit: Payer: Self-pay

## 2017-09-01 ENCOUNTER — Ambulatory Visit (HOSPITAL_COMMUNITY)
Admission: EM | Admit: 2017-09-01 | Discharge: 2017-09-01 | Disposition: A | Discharge status: Home / Self Care | Payer: BLUE CROSS/BLUE SHIELD | Attending: Family Medicine | Admitting: Family Medicine

## 2017-09-01 ENCOUNTER — Encounter (HOSPITAL_COMMUNITY): Payer: Self-pay | Admitting: Emergency Medicine

## 2017-09-01 DIAGNOSIS — M5412 Radiculopathy, cervical region: Secondary | ICD-10-CM

## 2017-09-01 MED ORDER — PREDNISONE 20 MG PO TABS: ORAL_TABLET | ORAL | 0 refills | Status: DC

## 2017-09-01 NOTE — ED Provider Notes (Signed)
[place]   [id number] 09/01/17 Arrival Time: 1005   SUBJECTIVE:  Julia Franklin is a 42 y.o. female who presents to the urgent care with complaint of right arm pain that starts in her shoulder and radiates all the way down to her hand.  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  Patient's discomfort originates in her posterior neck and affects her shoulder and ability to raise her arm.  She finds that she has pain radiating down her arm into the middle 2 fingers on the dorsal aspect of the right arm.  The discomfort has been going on for 1 month but she came in today because she had some swelling in the right hand for the first time.   Patient works at [employer] as a Freight forwarder.  She is taking Aleve for the discomfort and it has not really helped.  Past Medical History:  Diagnosis Date  . Anemia   . Anxiety   . Asthma   . Depression   . Fibroids   . Headache(784.0)    migraines   Family History  Problem Relation Age of Onset  . Hypertension Mother   . Stroke Mother   . Diabetes Father   . Stroke Father   . Parkinson's disease Maternal Grandmother   . Stroke Paternal Grandmother   . Cancer Sister        lung  . Cancer Brother        lung   Social History   Socioeconomic History  . Marital status: Married    Spouse name: Not on file  . Number of children: Not on file  . Years of education: Not on file  . Highest education level: Not on file  Occupational History  . Not on file  Social Needs  . Financial resource strain: Not on file  . Food insecurity:    Worry: Not on file    Inability: Not on file  . Transportation needs:    Medical: Not on file    Non-medical: Not on file  Tobacco Use  . Smoking status: Never Smoker  . Smokeless tobacco: Never Used  Substance and Sexual Activity  . Alcohol use: Yes    Alcohol/week: 0.0 oz    Comment: socially  . Drug use: No  . Sexual  activity: Yes    Comment: number of sex partners in the last 12 months  1  Lifestyle  . Physical activity:    Days per week: Not on file    Minutes per session: Not on file  . Stress: Not on file  Relationships  . Social connections:    Talks on phone: Not on file    Gets together: Not on file    Attends religious service: Not on file    Active member of club or organization: Not on file    Attends meetings of clubs or organizations: Not on file    Relationship status: Not on file  . Intimate partner violence:    Fear of current or ex partner: Not on file    Emotionally abused: Not on file    Physically abused: Not on file    Forced sexual activity: Not on file  Other Topics Concern  . Not on file  Social History Narrative  . Not on file   Current Meds  Medication Sig  . multivitamin-iron-minerals-folic acid (CENTRUM) chewable tablet Chew 1 tablet by mouth daily.  Marland Kitchen  naproxen sodium (ALEVE) 220 MG tablet Take 440 mg by mouth.   Allergies  Allergen Reactions  . Penicillins     Unknown reaction in childhood      ROS: As per HPI, remainder of ROS negative.   OBJECTIVE:   Vitals:   09/01/17 1027  BP: 135/85  Pulse: 65  Temp: 98.6 F (37 C)  TempSrc: Oral  SpO2: 100%     General appearance: alert; no distress Eyes: PERRL; EOMI; conjunctiva normal HENT: normocephalic; atraumatic;  oral mucosa normal Neck: supple; normal range of motion the patient is tender with palpation of the lower right paraspinal muscles of the cervical spine Back: no CVA tenderness Extremities: no cyanosis or edema; symmetrical with no gross deformities; slow abduction and forward elevation of the right upper extremity secondary to pain Skin: warm and dry; no rash Neurologic: normal gait; grossly normal Psychological: alert and cooperative; normal mood and affect      Labs:  Results for orders placed or performed in visit on 01/25/16  Procedure Report - Scanned  Result Value Ref  Range   Pap Negative for intraephithelial lesion or malignancy Negative for intraephithelial lesion or malignancy, Other    Labs Reviewed - No data to display  No results found.     ASSESSMENT & PLAN:  1. Cervical radiculopathy     Meds ordered this encounter  Medications  . predniSONE (DELTASONE) 20 MG tablet    Sig: Two daily with food    Dispense:  10 tablet    Refill:  0    Reviewed expectations re: course of current medical issues. Questions answered. Outlined signs and symptoms indicating need for more acute intervention. Patient verbalized understanding. After Visit Summary given.    Procedures:      Robyn Haber, MD 09/01/17 1046

## 2017-09-01 NOTE — Discharge Instructions (Addendum)
Please return if symptoms are not improving over the next 48 hours.

## 2017-09-01 NOTE — ED Triage Notes (Signed)
Pt reports right arm pain that starts in her shoulder and radiates all the way down to her hand.  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

## 2019-04-30 ENCOUNTER — Other Ambulatory Visit: Payer: Self-pay | Admitting: Obstetrics and Gynecology

## 2019-04-30 DIAGNOSIS — R928 Other abnormal and inconclusive findings on diagnostic imaging of breast: Secondary | ICD-10-CM

## 2019-05-09 ENCOUNTER — Ambulatory Visit
Admission: RE | Admit: 2019-05-09 | Discharge: 2019-05-09 | Disposition: A | Discharge status: Home / Self Care | Payer: BLUE CROSS/BLUE SHIELD | Source: Ambulatory Visit | Attending: Obstetrics and Gynecology | Admitting: Obstetrics and Gynecology

## 2019-05-09 ENCOUNTER — Ambulatory Visit
Admission: RE | Admit: 2019-05-09 | Discharge: 2019-05-09 | Disposition: A | Discharge status: Home / Self Care | Payer: BC Managed Care – PPO | Source: Ambulatory Visit | Attending: Obstetrics and Gynecology | Admitting: Obstetrics and Gynecology

## 2019-05-09 ENCOUNTER — Other Ambulatory Visit: Payer: Self-pay

## 2019-05-09 DIAGNOSIS — R928 Other abnormal and inconclusive findings on diagnostic imaging of breast: Secondary | ICD-10-CM

## 2020-09-26 ENCOUNTER — Other Ambulatory Visit: Payer: Self-pay | Admitting: Obstetrics and Gynecology

## 2020-09-26 DIAGNOSIS — R1900 Intra-abdominal and pelvic swelling, mass and lump, unspecified site: Secondary | ICD-10-CM

## 2020-09-30 ENCOUNTER — Other Ambulatory Visit: Payer: Self-pay | Admitting: Obstetrics and Gynecology

## 2020-09-30 DIAGNOSIS — R928 Other abnormal and inconclusive findings on diagnostic imaging of breast: Secondary | ICD-10-CM

## 2020-10-15 ENCOUNTER — Ambulatory Visit
Admission: RE | Admit: 2020-10-15 | Discharge: 2020-10-15 | Disposition: A | Discharge status: Home / Self Care | Payer: BC Managed Care – PPO | Source: Ambulatory Visit | Attending: Obstetrics and Gynecology | Admitting: Obstetrics and Gynecology

## 2020-10-15 ENCOUNTER — Other Ambulatory Visit: Payer: Self-pay

## 2020-10-15 DIAGNOSIS — R1900 Intra-abdominal and pelvic swelling, mass and lump, unspecified site: Secondary | ICD-10-CM

## 2020-10-15 MED ORDER — IOPAMIDOL (ISOVUE-300) INJECTION 61%
100.0000 mL | Freq: Once | INTRAVENOUS | Status: AC | PRN
  Administered 2020-10-15: 100 mL via INTRAVENOUS

## 2020-11-06 ENCOUNTER — Other Ambulatory Visit: Payer: BC Managed Care – PPO

## 2020-12-25 ENCOUNTER — Other Ambulatory Visit: Payer: Self-pay | Admitting: Obstetrics and Gynecology

## 2022-01-23 ENCOUNTER — Other Ambulatory Visit: Payer: Self-pay

## 2022-01-23 ENCOUNTER — Encounter (HOSPITAL_COMMUNITY): Payer: Self-pay

## 2022-01-23 ENCOUNTER — Emergency Department (HOSPITAL_COMMUNITY)
Admission: EM | Admit: 2022-01-23 | Discharge: 2022-01-23 | Disposition: A | Discharge status: Home / Self Care | Payer: BC Managed Care – PPO | Attending: Emergency Medicine | Admitting: Emergency Medicine

## 2022-01-23 DIAGNOSIS — X18XXXA Contact with other hot metals, initial encounter: Secondary | ICD-10-CM | POA: Diagnosis not present

## 2022-01-23 DIAGNOSIS — T24011A Burn of unspecified degree of right thigh, initial encounter: Secondary | ICD-10-CM | POA: Insufficient documentation

## 2022-01-23 DIAGNOSIS — T300 Burn of unspecified body region, unspecified degree: Secondary | ICD-10-CM

## 2022-01-23 DIAGNOSIS — R21 Rash and other nonspecific skin eruption: Secondary | ICD-10-CM | POA: Diagnosis present

## 2022-01-23 MED ORDER — BACITRACIN ZINC 500 UNIT/GM EX OINT
TOPICAL_OINTMENT | Freq: Once | CUTANEOUS | Status: AC
  Administered 2022-01-23: 1 via TOPICAL
  Filled 2022-01-23: qty 0.9

## 2022-01-23 NOTE — Discharge Instructions (Signed)
It was a pleasure taking care of you today!   You will be given bacitracin ointment in the ED today, use as directed. Ensure to keep the area moisturized with emollient such as Aquaphor, Aveeno, Eucerin cream.  You may return to the emergency department for experiencing increasing or worsening symptoms.

## 2022-01-23 NOTE — ED Triage Notes (Signed)
Pt reports after getting home from [place] rash noted to right buttocks and thigh with pain shooting down right leg x5days

## 2022-01-23 NOTE — ED Provider Notes (Signed)
[facility] DEPT Provider Note   CSN: [id number] Arrival date & time: 01/23/22  7412     History  Chief Complaint  Patient presents with   Rash    Julia Franklin is a 46 y.o. female who presents to the emergency department with concerns for rash onset 5 days.  Was recently in [place] prior to the onset of her symptoms.  Notes that the rash is localized to the right buttocks and thigh with pain to the area.  Notes it is intermittently pruritic.  Denies any new foods, medications, shampoo, conditioner.  Notes that she use the soap [place] the [place].  No fever or drainage noted to the area.  The history is provided by the patient. No language interpreter was used.       Home Medications Prior to Admission medications   Medication Sig Start Date End Date Taking? Authorizing Provider  multivitamin-iron-minerals-folic acid (CENTRUM) chewable tablet Chew 1 tablet by mouth daily. 04/02/14   Le, Thao P, DO  naproxen sodium (ALEVE) 220 MG tablet Take 440 mg by mouth.    [provider]  predniSONE (DELTASONE) 20 MG tablet Two daily with food 09/01/17   Robyn Haber, MD      Allergies    Penicillin g and Penicillins    Review of Systems   Review of Systems  Constitutional:  Negative for fever.  Skin:  Positive for rash. Negative for color change and wound.  All other systems reviewed and are negative.   Physical Exam Updated Vital Signs BP (!) 159/81 (BP Location: Right Arm)   Pulse 70   Temp 99.3 F (37.4 C) (Oral)   Resp 16   Ht '5\' 3"'$  (1.6 m)   Wt 83 kg   LMP 12/23/2021   SpO2 100%   BMI 32.41 kg/m  Physical Exam Vitals and nursing note reviewed.  Constitutional:      General: She is not in acute distress.    Appearance: Normal appearance.  Eyes:     General: No scleral icterus.    Extraocular Movements: Extraocular movements intact.  Cardiovascular:     Rate and Rhythm: Normal rate.  Pulmonary:      Effort: Pulmonary effort is normal. No respiratory distress.  Abdominal:     Palpations: Abdomen is soft. There is no mass.     Tenderness: There is no abdominal tenderness.  Musculoskeletal:        General: Normal range of motion.     Cervical back: Neck supple.  Skin:    General: Skin is warm and dry.     Findings: No rash.     Comments: 5 areas of what appears to be superficial burn noted to the right lateral thigh and anterior thigh as well as to the buttocks.  Area is not circumferential.  The area is blanchable.  No tenderness to palpation noted to the area.  Neurological:     Mental Status: She is alert.     Sensory: Sensation is intact.     Motor: Motor function is intact.  Psychiatric:        Behavior: Behavior normal.     ED Results / Procedures / Treatments   Labs (all labs ordered are listed, but only abnormal results are displayed) Labs Reviewed - No data to display  EKG None  Radiology No results found.  Procedures Procedures    Medications Ordered in ED Medications  bacitracin ointment (1 Application Topical Given 01/23/22 2054)  ED Course/ Medical Decision Making/ A&P Clinical Course as of 01/23/22 2102  Sat Jan 23, 2022  2032 Attending in to evaluate the patient and agrees with discharge treatment plan. Pt appears safe for discharge. [SB]    Clinical Course User Index [SB] Drayden Lukas A, PA-C                           Medical Decision Making Risk OTC drugs.   Pt presents with rash onset 5 days.  No meds tried prior to arrival.  Notes that she was recently in [place] prior to the onset of her symptoms.  The rash is intermittently pruritic.  No new foods, medications, soap, conditioner.  Notes that she [place] soap at the resort while on [place].  Patient afebrile.  On exam patient with 5 areas of what appears to be superficial burn noted to the right lateral thigh and anterior thigh as well as to the buttocks.  Area is not circumferential.   The area is blanchable.  No tenderness to palpation noted to the area.  No acute cardiovascular respiratory exam findings.  Differential diagnosis includes contact dermatitis, allergic reaction, burn, sunburn, abscess, cellulitis.  Medications:  I ordered medication including bacitracin ointment  I have reviewed the patients home medicines and have made adjustments as needed  Social Determinants of Health: Patient does not have a primary care provider at this time.  Disposition: Presentation suspicious for likely superficial burn.  Doubt cellulitis or abscess at this time.  Doubt syndrome at this time. After consideration of the diagnostic results and the patients response to treatment, I feel that the patient would benefit from Discharge home. Supportive care measures and strict return precautions discussed with patient at bedside. Pt acknowledges and verbalizes understanding. Pt appears safe for discharge. Follow up as indicated in discharge paperwork.    This chart was dictated using voice recognition software, Dragon. Despite the best efforts of this provider to proofread and correct errors, errors may still occur which can change documentation meaning.   Final Clinical Impression(s) / ED Diagnoses Final diagnoses:  Rash  Superficial burn    Rx / DC Orders ED Discharge Orders     None         Terrie Haring A, PA-C 01/23/22 2102    Oneal Deputy K, DO 01/23/22 2320

## 2022-06-23 ENCOUNTER — Other Ambulatory Visit: Payer: Self-pay | Admitting: Family

## 2022-06-23 DIAGNOSIS — N6320 Unspecified lump in the left breast, unspecified quadrant: Secondary | ICD-10-CM

## 2022-07-05 ENCOUNTER — Ambulatory Visit
Admission: RE | Admit: 2022-07-05 | Discharge: 2022-07-05 | Disposition: A | Discharge status: Home / Self Care | Payer: BC Managed Care – PPO | Source: Ambulatory Visit | Attending: Family | Admitting: Family

## 2022-07-05 DIAGNOSIS — N6320 Unspecified lump in the left breast, unspecified quadrant: Secondary | ICD-10-CM

## 2022-11-10 ENCOUNTER — Other Ambulatory Visit: Payer: Self-pay | Admitting: Internal Medicine

## 2022-11-11 LAB — COMPLETE METABOLIC PANEL WITH GFR
AG Ratio: 1.6 (calc) (ref 1.0–2.5)
ALT: 7 U/L (ref 6–29)
AST: 18 U/L (ref 10–35)
Albumin: 4.4 g/dL (ref 3.6–5.1)
Alkaline phosphatase (APISO): 50 U/L (ref 31–125)
BUN: 13 mg/dL (ref 7–25)
CO2: 24 mmol/L (ref 20–32)
Calcium: 8.9 mg/dL (ref 8.6–10.2)
Chloride: 106 mmol/L (ref 98–110)
Creat: 0.97 mg/dL (ref 0.50–0.99)
Globulin: 2.7 g/dL (calc) (ref 1.9–3.7)
Glucose, Bld: 76 mg/dL (ref 65–99)
Potassium: 4.2 mmol/L (ref 3.5–5.3)
Sodium: 139 mmol/L (ref 135–146)
Total Bilirubin: 0.6 mg/dL (ref 0.2–1.2)
Total Protein: 7.1 g/dL (ref 6.1–8.1)
eGFR: 72 mL/min/{1.73_m2} (ref >=60)

## 2022-11-11 LAB — IRON,TIBC AND FERRITIN PANEL
%SAT: 5 % (calc) — ABNORMAL LOW (ref 16–45)
Ferritin: 2 ng/mL — ABNORMAL LOW (ref 16–232)
Iron: 21 ug/dL — ABNORMAL LOW (ref 40–190)
TIBC: 459 mcg/dL (calc) — ABNORMAL HIGH (ref 250–450)

## 2022-11-11 LAB — LIPID PANEL
Cholesterol: 192 mg/dL (ref <200)
HDL: 72 mg/dL (ref >=50)
LDL Cholesterol (Calc): 107 mg/dL (calc) — ABNORMAL HIGH
Non-HDL Cholesterol (Calc): 120 mg/dL (calc) (ref <130)
Total CHOL/HDL Ratio: 2.7 (calc) (ref <5.0)
Triglycerides: 51 mg/dL (ref <150)

## 2022-11-11 LAB — CBC
HCT: 26.5 % — ABNORMAL LOW (ref 35.0–45.0)
Hemoglobin: 7.9 g/dL — ABNORMAL LOW (ref 11.7–15.5)
MCH: 21.5 pg — ABNORMAL LOW (ref 27.0–33.0)
MCHC: 29.8 g/dL — ABNORMAL LOW (ref 32.0–36.0)
MCV: 72.2 fL — ABNORMAL LOW (ref 80.0–100.0)
MPV: 10.6 fL (ref 7.5–12.5)
Platelets: 426 10*3/uL — ABNORMAL HIGH (ref 140–400)
RBC: 3.67 10*6/uL — ABNORMAL LOW (ref 3.80–5.10)
RDW: 14.5 % (ref 11.0–15.0)
WBC: 8.1 10*3/uL (ref 3.8–10.8)

## 2022-11-11 LAB — VITAMIN D 25 HYDROXY (VIT D DEFICIENCY, FRACTURES): Vit D, 25-Hydroxy: 22 ng/mL — ABNORMAL LOW (ref 30–100)

## 2022-11-11 LAB — B12 AND FOLATE PANEL
Folate: 15 ng/mL
Vitamin B-12: 878 pg/mL (ref 200–1100)

## 2022-11-11 LAB — SICKLE CELL SCREEN: Sickle Solubility Test - HGBRFX: POSITIVE — AB

## 2022-11-11 LAB — TSH: TSH: 0.92 mIU/L

## 2023-01-14 ENCOUNTER — Encounter (HOSPITAL_BASED_OUTPATIENT_CLINIC_OR_DEPARTMENT_OTHER): Payer: Self-pay | Admitting: Orthopaedic Surgery

## 2023-01-14 ENCOUNTER — Other Ambulatory Visit: Payer: Self-pay

## 2023-01-19 NOTE — Discharge Instructions (Signed)
Ramond Marrow MD, MPH Alfonse Alpers, PA-C [facility] [address] [phone] (tel)   [phone] (fax)   POST-OPERATIVE INSTRUCTIONS - Knee Arthroscopy  WOUND CARE - You may remove the Operative Dressing on Post-Op Day #3 (72hrs after surgery).   -  Alternatively if you would like you can leave dressing on until follow-up if within 7-8 days but keep it dry. - Leave steri-strips in place until they fall off on their own, usually 2 weeks postop. - An ACE wrap may be used to control swelling, do not wrap this too tight.  If the initial ACE wrap feels too tight you may loosen it. - There may be a small amount of fluid/bleeding leaking at the surgical site.  - This is normal; the knee is filled with fluid during the procedure and can leak for 24-48hrs after surgery. You may change/reinforce the bandage as needed.  - Use the Cryocuff or Ice as often as possible for the first 7 days, then as needed for pain relief. Always keep a towel, ACE wrap or other barrier between the cooling unit and your skin.  - You may shower on Post-Op Day #3. Gently pat the area dry.  - Do not soak the knee in water or submerge it.  - Do not go swimming in the pool or ocean until 4 weeks after surgery or when otherwise instructed.  Keep dry incisions as dry as possible.   BRACE/AMBULATION  -            You will not need a brace after this procedure.   - You may use crutches initially to help you weight bear, but this is not required - You can put full weight on your operative leg as you feel comfortable  PHYSICAL THERAPY - You will begin physical therapy soon after surgery (unless otherwise specified) - Please call to set up an appointment, if you do not already have one  - Let our office if there are any issues with scheduling your therapy  - You have a physical therapy appointment scheduled at [facility] (across the hall from our office) on 9/24  REGIONAL ANESTHESIA (NERVE  BLOCKS) The anesthesia team may have performed a nerve block for you this is a great tool used to minimize pain.   The block may start wearing off overnight (between 8-24 hours postop) When the block wears off, your pain may go from nearly zero to the pain you would have had postop without the block. This is an abrupt transition but nothing dangerous is happening.   This can be a challenging period but utilize your as needed pain medications to try and manage this period. We suggest you use the pain medication the first night prior to going to bed, to ease this transition.  You may take an extra dose of narcotic when this happens if needed   POST-OP MEDICATIONS- Multimodal approach to pain control In general your pain will be controlled with a combination of substances.  Prescriptions unless otherwise discussed are electronically sent to your pharmacy.  This is a carefully made plan we use to minimize narcotic use.     Celebrex - Anti-inflammatory medication taken on a scheduled basis Acetaminophen - Non-narcotic pain medicine taken on a scheduled basis  Tramadol - This is a strong narcotic, to be used only on an "as needed" basis for SEVERE pain. Aspirin 81mg  - This medicine is used to minimize the risk of blood clots after surgery.  Zofran - take as needed for nausea   FOLLOW-UP   Please call the office to schedule a follow-up appointment for your incision check, 7-10 days post-operatively.   IF YOU HAVE ANY QUESTIONS, PLEASE FEEL FREE TO CALL OUR OFFICE.   HELPFUL INFORMATION   Keep your leg elevated to decrease swelling, which will then in turn decrease your pain. I would elevate the foot of your bed by putting a couple of couch pillows between your mattress and box spring. I would not keep pillow directly under your ankle.  - Do not sleep with a pillow behind your knee even if it is more comfortable as this may make it harder to get your knee fully straight long term.   There  will be MORE swelling on days 1-3 than there is on the day of surgery.  This also is normal. The swelling will decrease with the anti-inflammatory medication, ice and keeping it elevated. The swelling will make it more difficult to bend your knee. As the swelling goes down your motion will become easier   You may develop swelling and bruising that extends from your knee down to your calf and perhaps even to your foot over the next week. Do not be alarmed. This too is normal, and it is due to gravity   There may be some numbness adjacent to the incision site. This may last for 6-12 months or longer in some patients and is expected.   You may return to sedentary work/school in the next couple of days when you feel up to it. You will need to keep your leg elevated as much as possible    You should wean off your narcotic medicines as soon as you are able.  Most patients will be off narcotics before their first postop appointment.    We suggest you use the pain medication the first night prior to going to bed, in order to ease any pain when the anesthesia wears off. You should avoid taking pain medications on an empty stomach as it will make you nauseous.   Do not drink alcoholic beverages or take illicit drugs when taking pain medications.   It is against the law to drive while taking narcotics. You cannot drive if your Right leg is in brace locked in extension.   Pain medication may make you constipated.  Below are a few solutions to try in this order:  o Decrease the amount of pain medication if you aren't having pain.  o Drink lots of decaffeinated fluids.  o Drink prune juice and/or eat dried prunes   o If the first 3 don't work start with additional solutions  o Take Colace - an over-the-counter stool softener  o Take Senokot - an over-the-counter laxative  o Take Miralax - a stronger over-the-counter laxative    For more information including helpful videos and documents visit  our website:   [email or web address]

## 2023-01-19 NOTE — Anesthesia Preprocedure Evaluation (Signed)
Anesthesia Evaluation  Patient identified by MRN, date of birth, ID band Patient awake    Reviewed: Allergy & Precautions, NPO status , Patient's Chart, lab work & pertinent test results  Airway Mallampati: II  TM Distance: >3 FB Neck ROM: Full    Dental no notable dental hx. (+) Dental Advisory Given   Pulmonary asthma    Pulmonary exam normal breath sounds clear to auscultation       Cardiovascular negative cardio ROS Normal cardiovascular exam Rhythm:Regular Rate:Normal     Neuro/Psych  Headaches PSYCHIATRIC DISORDERS Anxiety Depression       GI/Hepatic negative GI ROS, Neg liver ROS,,,  Endo/Other  negative endocrine ROS    Renal/GU negative Renal ROS     Musculoskeletal negative musculoskeletal ROS (+)    Abdominal  (+) + obese  Peds  Hematology  (+) Blood dyscrasia, anemia   Anesthesia Other Findings   Reproductive/Obstetrics                             Anesthesia Physical Anesthesia Plan  ASA: 2  Anesthesia Plan: General   Post-op Pain Management: Tylenol PO (pre-op)* and Celebrex PO (pre-op)*   Induction: Intravenous  PONV Risk Score and Plan: 4 or greater and Ondansetron, Dexamethasone, Midazolam and Treatment may vary due to age or medical condition  Airway Management Planned: LMA  Additional Equipment:   Intra-op Plan:   Post-operative Plan: Extubation in OR  Informed Consent: I have reviewed the patients History and Physical, chart, labs and discussed the procedure including the risks, benefits and alternatives for the proposed anesthesia with the patient or authorized representative who has indicated his/her understanding and acceptance.     Dental advisory given  Plan Discussed with: CRNA  Anesthesia Plan Comments:        Anesthesia Quick Evaluation

## 2023-01-19 NOTE — Progress Notes (Signed)
CHG pre surgical soap given to patient with instructions. Pt verbalized understanding

## 2023-01-19 NOTE — H&P (Signed)
PREOPERATIVE H&P  Chief Complaint: right knee medial meniscus tear, synovitis  HPI: Julia Franklin is a 47 y.o. female who is scheduled for, Procedure(s): KNEE ARTHROSCOPY WITH MEDIAL MENISECTOMY SYNOVECTOMY.   Patient has a past medical history significant for anemia.   Patient has had pain in her right knee for quite some time. She has mechanical symptoms. She has trouble at work.   Symptoms are rated as moderate to severe, and have been worsening.  This is significantly impairing activities of daily living.    Please see clinic note for further details on this patient's care.    She has elected for surgical management.   Past Medical History:  Diagnosis Date   Anemia    Anxiety    Asthma    Depression    Fibroids    Headache(784.0)    migraines   Past Surgical History:  Procedure Laterality Date   CESAREAN SECTION  1993   KNEE SURGERY     left  ACL   MYOMECTOMY N/A 01/31/2013   Procedure: ABDOMINAL MYOMECTOMY;  Surgeon: Kathreen Cosier, MD;  Location: [facility];  Service: Gynecology;  Laterality: N/A;   URETHRAL DILATION     Social History   Socioeconomic History   Marital status: Married    Spouse name: Not on file   Number of children: Not on file   Years of education: Not on file   Highest education level: Not on file  Occupational History   Not on file  Tobacco Use   Smoking status: Never   Smokeless tobacco: Never  Vaping Use   Vaping status: Never Used  Substance and Sexual Activity   Alcohol use: Yes    Alcohol/week: 0.0 standard drinks of alcohol    Comment: socially   Drug use: No   Sexual activity: Yes    Comment: number of sex partners in the last 12 months  1  Other Topics Concern   Not on file  Social History Narrative   Not on file   Social Determinants of Health   Financial Resource Strain: Not on file  Food Insecurity: Not on file  Transportation Needs: Not on file  Physical Activity: Not on file  Stress: Not on file  Social  Connections: Not on file   Family History  Problem Relation Age of Onset   Hypertension Mother    Stroke Mother    Breast cancer Mother 11   Diabetes Father    Stroke Father    Parkinson's disease Maternal Grandmother    Stroke Paternal Grandmother    Cancer Sister        lung   Cancer Brother        lung   Allergies  Allergen Reactions   Penicillins Itching    Unknown reaction in childhood   Prior to Admission medications   Not on File    ROS: All other systems have been reviewed and were otherwise negative with the exception of those mentioned in the HPI and as above.  Physical Exam: General: Alert, no acute distress Cardiovascular: No pedal edema Respiratory: No cyanosis, no use of accessory musculature GI: No organomegaly, abdomen is soft and non-tender Skin: No lesions in the area of chief complaint Neurologic: Sensation intact distally Psychiatric: Patient is competent for consent with normal mood and affect Lymphatic: No axillary or cervical lymphadenopathy  MUSCULOSKELETAL:  Right knee: positive mcmurray's. Tender to palpation medial joint line.   Imaging: MRI demonstrates a medial meniscus tear with a  parrot beak component  Assessment: right knee medial meniscus tear, synovitis  Plan: Plan for Procedure(s): KNEE ARTHROSCOPY WITH MEDIAL MENISECTOMY SYNOVECTOMY  The risks benefits and alternatives were discussed with the patient including but not limited to the risks of nonoperative treatment, versus surgical intervention including infection, bleeding, nerve injury,  blood clots, cardiopulmonary complications, morbidity, mortality, among others, and they were willing to proceed.   The patient acknowledged the explanation, agreed to proceed with the plan and consent was signed.   Operative Plan: right knee scope with medial meniscectomy Discharge Medications: standard DVT Prophylaxis: aspirin Physical Therapy: outpatient PT Special Discharge needs:  +/-   Vernetta Honey, PA-C  01/19/2023 9:19 AM

## 2023-01-20 ENCOUNTER — Ambulatory Visit (HOSPITAL_BASED_OUTPATIENT_CLINIC_OR_DEPARTMENT_OTHER): Payer: BC Managed Care – PPO | Admitting: Anesthesiology

## 2023-01-20 ENCOUNTER — Encounter (HOSPITAL_BASED_OUTPATIENT_CLINIC_OR_DEPARTMENT_OTHER): Payer: Self-pay | Admitting: Orthopaedic Surgery

## 2023-01-20 ENCOUNTER — Ambulatory Visit (HOSPITAL_BASED_OUTPATIENT_CLINIC_OR_DEPARTMENT_OTHER)
Admission: RE | Admit: 2023-01-20 | Discharge: 2023-01-20 | Disposition: A | Discharge status: Home / Self Care | Payer: BC Managed Care – PPO | Attending: Orthopaedic Surgery | Admitting: Orthopaedic Surgery

## 2023-01-20 ENCOUNTER — Encounter (HOSPITAL_BASED_OUTPATIENT_CLINIC_OR_DEPARTMENT_OTHER)
Admission: RE | Disposition: A | Discharge status: Home / Self Care | Payer: Self-pay | Source: Home / Self Care | Attending: Orthopaedic Surgery

## 2023-01-20 ENCOUNTER — Other Ambulatory Visit: Payer: Self-pay

## 2023-01-20 DIAGNOSIS — M659 Synovitis and tenosynovitis, unspecified: Secondary | ICD-10-CM | POA: Insufficient documentation

## 2023-01-20 DIAGNOSIS — X58XXXA Exposure to other specified factors, initial encounter: Secondary | ICD-10-CM | POA: Insufficient documentation

## 2023-01-20 DIAGNOSIS — S83241A Other tear of medial meniscus, current injury, right knee, initial encounter: Secondary | ICD-10-CM | POA: Diagnosis present

## 2023-01-20 DIAGNOSIS — Z01818 Encounter for other preprocedural examination: Secondary | ICD-10-CM

## 2023-01-20 DIAGNOSIS — J45909 Unspecified asthma, uncomplicated: Secondary | ICD-10-CM | POA: Diagnosis not present

## 2023-01-20 HISTORY — PX: KNEE ARTHROSCOPY WITH MEDIAL MENISECTOMY: SHX5651

## 2023-01-20 HISTORY — PX: SYNOVECTOMY: SHX5180

## 2023-01-20 LAB — POCT PREGNANCY, URINE: Preg Test, Ur: NEGATIVE

## 2023-01-20 SURGERY — ARTHROSCOPY, KNEE, WITH MEDIAL MENISCECTOMY
Anesthesia: General | Site: Knee | Laterality: Right

## 2023-01-20 MED ORDER — EPHEDRINE 5 MG/ML INJ
INTRAVENOUS | Status: AC
  Filled 2023-01-20: qty 5

## 2023-01-20 MED ORDER — HYDROMORPHONE HCL 1 MG/ML IJ SOLN
0.2500 mg | INTRAMUSCULAR | Status: DC | PRN
  Administered 2023-01-20: 0.25 mg via INTRAVENOUS
  Administered 2023-01-20 (×2): 0.5 mg via INTRAVENOUS
  Administered 2023-01-20: 0.25 mg via INTRAVENOUS

## 2023-01-20 MED ORDER — ATROPINE SULFATE 0.4 MG/ML IV SOLN
INTRAVENOUS | Status: AC
  Filled 2023-01-20: qty 1

## 2023-01-20 MED ORDER — ACETAMINOPHEN 500 MG PO TABS: 1000.0000 mg | ORAL_TABLET | Freq: Three times a day (TID) | ORAL | 0 refills | Status: AC

## 2023-01-20 MED ORDER — HYDROMORPHONE HCL 1 MG/ML IJ SOLN
INTRAMUSCULAR | Status: AC
  Filled 2023-01-20: qty 0.5

## 2023-01-20 MED ORDER — BUPIVACAINE HCL (PF) 0.25 % IJ SOLN
INTRAMUSCULAR | Status: DC | PRN
  Administered 2023-01-20: 20 mL

## 2023-01-20 MED ORDER — OXYCODONE HCL 5 MG/5ML PO SOLN: 5.0000 mg | Freq: Once | ORAL | Status: AC | PRN

## 2023-01-20 MED ORDER — OXYCODONE HCL 5 MG PO TABS
ORAL_TABLET | ORAL | Status: AC
  Filled 2023-01-20: qty 1

## 2023-01-20 MED ORDER — ONDANSETRON HCL 4 MG PO TABS: 4.0000 mg | ORAL_TABLET | Freq: Three times a day (TID) | ORAL | 0 refills | Status: AC | PRN

## 2023-01-20 MED ORDER — CEFAZOLIN SODIUM-DEXTROSE 2-4 GM/100ML-% IV SOLN
INTRAVENOUS | Status: AC
  Filled 2023-01-20: qty 100

## 2023-01-20 MED ORDER — ONDANSETRON HCL 4 MG/2ML IJ SOLN
INTRAMUSCULAR | Status: DC | PRN
  Administered 2023-01-20: 4 mg via INTRAVENOUS

## 2023-01-20 MED ORDER — OXYCODONE HCL 5 MG PO TABS
5.0000 mg | ORAL_TABLET | Freq: Once | ORAL | Status: AC | PRN
  Administered 2023-01-20: 5 mg via ORAL

## 2023-01-20 MED ORDER — TRAMADOL HCL 50 MG PO TABS
50.0000 mg | ORAL_TABLET | Freq: Four times a day (QID) | ORAL | 0 refills | Status: AC | PRN
Start: 2023-01-20 — End: ?

## 2023-01-20 MED ORDER — MIDAZOLAM HCL 2 MG/2ML IJ SOLN
INTRAMUSCULAR | Status: AC
  Filled 2023-01-20: qty 2

## 2023-01-20 MED ORDER — CEFAZOLIN SODIUM-DEXTROSE 2-4 GM/100ML-% IV SOLN
2.0000 g | INTRAVENOUS | Status: AC
  Administered 2023-01-20: 2 g via INTRAVENOUS

## 2023-01-20 MED ORDER — MIDAZOLAM HCL 5 MG/5ML IJ SOLN
INTRAMUSCULAR | Status: DC | PRN
  Administered 2023-01-20: 2 mg via INTRAVENOUS

## 2023-01-20 MED ORDER — PHENYLEPHRINE 80 MCG/ML (10ML) SYRINGE FOR IV PUSH (FOR BLOOD PRESSURE SUPPORT)
PREFILLED_SYRINGE | INTRAVENOUS | Status: AC
  Filled 2023-01-20: qty 10

## 2023-01-20 MED ORDER — ACETAMINOPHEN 500 MG PO TABS
1000.0000 mg | ORAL_TABLET | Freq: Once | ORAL | Status: AC
  Administered 2023-01-20: 1000 mg via ORAL

## 2023-01-20 MED ORDER — CELECOXIB 200 MG PO CAPS
200.0000 mg | ORAL_CAPSULE | Freq: Once | ORAL | Status: AC
  Administered 2023-01-20: 200 mg via ORAL

## 2023-01-20 MED ORDER — SODIUM CHLORIDE 0.9 % IR SOLN
Status: DC | PRN
  Administered 2023-01-20: 500 mL

## 2023-01-20 MED ORDER — GABAPENTIN 300 MG PO CAPS
300.0000 mg | ORAL_CAPSULE | Freq: Once | ORAL | Status: AC
  Administered 2023-01-20: 300 mg via ORAL

## 2023-01-20 MED ORDER — DEXAMETHASONE SODIUM PHOSPHATE 10 MG/ML IJ SOLN
INTRAMUSCULAR | Status: AC
  Filled 2023-01-20: qty 1

## 2023-01-20 MED ORDER — MEPERIDINE HCL 25 MG/ML IJ SOLN: 6.2500 mg | INTRAMUSCULAR | Status: DC | PRN

## 2023-01-20 MED ORDER — PROMETHAZINE HCL 25 MG/ML IJ SOLN: 6.2500 mg | INTRAMUSCULAR | Status: DC | PRN

## 2023-01-20 MED ORDER — GABAPENTIN 300 MG PO CAPS
ORAL_CAPSULE | ORAL | Status: AC
  Filled 2023-01-20: qty 1

## 2023-01-20 MED ORDER — DROPERIDOL 2.5 MG/ML IJ SOLN: 0.6250 mg | Freq: Once | INTRAMUSCULAR | Status: DC | PRN

## 2023-01-20 MED ORDER — LIDOCAINE 2% (20 MG/ML) 5 ML SYRINGE
INTRAMUSCULAR | Status: AC
  Filled 2023-01-20: qty 5

## 2023-01-20 MED ORDER — ONDANSETRON HCL 4 MG/2ML IJ SOLN
INTRAMUSCULAR | Status: AC
  Filled 2023-01-20: qty 2

## 2023-01-20 MED ORDER — CELECOXIB 200 MG PO CAPS
ORAL_CAPSULE | ORAL | Status: AC
  Filled 2023-01-20: qty 1

## 2023-01-20 MED ORDER — FENTANYL CITRATE (PF) 100 MCG/2ML IJ SOLN
INTRAMUSCULAR | Status: DC | PRN
  Administered 2023-01-20 (×2): 50 ug via INTRAVENOUS

## 2023-01-20 MED ORDER — SUCCINYLCHOLINE CHLORIDE 200 MG/10ML IV SOSY
PREFILLED_SYRINGE | INTRAVENOUS | Status: AC
  Filled 2023-01-20: qty 10

## 2023-01-20 MED ORDER — LIDOCAINE HCL (CARDIAC) PF 100 MG/5ML IV SOSY
PREFILLED_SYRINGE | INTRAVENOUS | Status: DC | PRN
  Administered 2023-01-20: 75 mg via INTRAVENOUS

## 2023-01-20 MED ORDER — ASPIRIN 81 MG PO CHEW: 81.0000 mg | CHEWABLE_TABLET | Freq: Two times a day (BID) | ORAL | 0 refills | Status: AC

## 2023-01-20 MED ORDER — FENTANYL CITRATE (PF) 100 MCG/2ML IJ SOLN
INTRAMUSCULAR | Status: AC
  Filled 2023-01-20: qty 2

## 2023-01-20 MED ORDER — CELECOXIB 100 MG PO CAPS: 100.0000 mg | ORAL_CAPSULE | Freq: Two times a day (BID) | ORAL | 0 refills | Status: AC

## 2023-01-20 MED ORDER — BUPIVACAINE HCL (PF) 0.25 % IJ SOLN
INTRAMUSCULAR | Status: AC
  Filled 2023-01-20: qty 90

## 2023-01-20 MED ORDER — ACETAMINOPHEN 500 MG PO TABS: 1000.0000 mg | ORAL_TABLET | Freq: Once | ORAL | Status: DC

## 2023-01-20 MED ORDER — PROPOFOL 10 MG/ML IV BOLUS
INTRAVENOUS | Status: DC | PRN
  Administered 2023-01-20: 160 mg via INTRAVENOUS

## 2023-01-20 MED ORDER — LACTATED RINGERS IV SOLN: INTRAVENOUS | Status: DC

## 2023-01-20 MED ORDER — DEXAMETHASONE SODIUM PHOSPHATE 4 MG/ML IJ SOLN
INTRAMUSCULAR | Status: DC | PRN
  Administered 2023-01-20: 5 mg via INTRAVENOUS

## 2023-01-20 MED ORDER — ACETAMINOPHEN 500 MG PO TABS
ORAL_TABLET | ORAL | Status: AC
  Filled 2023-01-20: qty 2

## 2023-01-20 SURGICAL SUPPLY — 39 items
APL PRP STRL LF DISP 70% ISPRP (MISCELLANEOUS) ×1
BANDAGE ESMARK 6X9 LF (GAUZE/BANDAGES/DRESSINGS) IMPLANT
BNDG CMPR 6 X 5 YARDS HK CLSR (GAUZE/BANDAGES/DRESSINGS) ×1
BNDG CMPR 9X6 STRL LF SNTH (GAUZE/BANDAGES/DRESSINGS)
BNDG ELASTIC 6INX 5YD STR LF (GAUZE/BANDAGES/DRESSINGS) ×1 IMPLANT
BNDG ESMARK 6X9 LF (GAUZE/BANDAGES/DRESSINGS)
CHLORAPREP W/TINT 26 (MISCELLANEOUS) ×1 IMPLANT
CLSR STERI-STRIP ANTIMIC 1/2X4 (GAUZE/BANDAGES/DRESSINGS) ×1 IMPLANT
CUFF TOURN SGL QUICK 34 (TOURNIQUET CUFF)
CUFF TRNQT CYL 34X4.125X (TOURNIQUET CUFF) ×1 IMPLANT
DISSECTOR 4.0MMX13CM CVD (MISCELLANEOUS) ×1 IMPLANT
DRAPE ARTHROSCOPY W/POUCH 90 (DRAPES) ×1 IMPLANT
DRAPE IMP U-DRAPE 54X76 (DRAPES) ×1 IMPLANT
DRAPE U-SHAPE 47X51 STRL (DRAPES) ×1 IMPLANT
GAUZE SPONGE 4X4 12PLY STRL (GAUZE/BANDAGES/DRESSINGS) ×1 IMPLANT
GLOVE BIO SURGEON STRL SZ 6.5 (GLOVE) ×1 IMPLANT
GLOVE BIOGEL PI IND STRL 6.5 (GLOVE) ×1 IMPLANT
GLOVE BIOGEL PI IND STRL 7.0 (GLOVE) IMPLANT
GLOVE BIOGEL PI IND STRL 7.5 (GLOVE) IMPLANT
GLOVE BIOGEL PI IND STRL 8 (GLOVE) ×1 IMPLANT
GLOVE ECLIPSE 8.0 STRL XLNG CF (GLOVE) ×2 IMPLANT
GLOVE SURG SYN 7.5 E (GLOVE) ×1
GLOVE SURG SYN 7.5 PF PI (GLOVE) IMPLANT
GOWN STRL REUS W/ TWL LRG LVL3 (GOWN DISPOSABLE) ×1 IMPLANT
GOWN STRL REUS W/ TWL XL LVL3 (GOWN DISPOSABLE) IMPLANT
GOWN STRL REUS W/TWL LRG LVL3 (GOWN DISPOSABLE) ×2
GOWN STRL REUS W/TWL XL LVL3 (GOWN DISPOSABLE) ×2 IMPLANT
KIT TURNOVER KIT B (KITS) ×1 IMPLANT
MANIFOLD NEPTUNE II (INSTRUMENTS) IMPLANT
NS IRRIG 1000ML POUR BTL (IV SOLUTION) IMPLANT
PACK ARTHROSCOPY DSU (CUSTOM PROCEDURE TRAY) ×1 IMPLANT
SLEEVE SCD COMPRESS KNEE MED (STOCKING) ×1 IMPLANT
SUT MNCRL AB 4-0 PS2 18 (SUTURE) ×1 IMPLANT
TOWEL GREEN STERILE FF (TOWEL DISPOSABLE) ×1 IMPLANT
TUBE CONNECTING 20X1/4 (TUBING) ×1 IMPLANT
TUBING ARTHROSCOPY IRRIG 16FT (MISCELLANEOUS) ×1 IMPLANT
WAND ABLATOR APOLLO I90 (BUR) IMPLANT
WATER STERILE IRR 1000ML POUR (IV SOLUTION) ×1 IMPLANT
WRAP KNEE MAXI GEL POST OP (GAUZE/BANDAGES/DRESSINGS) IMPLANT

## 2023-01-20 NOTE — Anesthesia Procedure Notes (Signed)
Procedure Name: LMA Insertion Date/Time: 01/20/2023 7:36 AM  Performed by: Ronnette Hila, CRNAPre-anesthesia Checklist: Patient identified, Emergency Drugs available, Suction available and Patient being monitored Patient Re-evaluated:Patient Re-evaluated prior to induction Oxygen Delivery Method: Circle System Utilized Preoxygenation: Pre-oxygenation with 100% oxygen Induction Type: IV induction Ventilation: Mask ventilation without difficulty LMA: LMA inserted LMA Size: 4.0 Number of attempts: 1 Airway Equipment and Method: bite block Placement Confirmation: positive ETCO2 Tube secured with: Tape Dental Injury: Teeth and Oropharynx as per pre-operative assessment

## 2023-01-20 NOTE — Interval H&P Note (Signed)
All questions answered, patient wants to proceed with procedure. ? ?

## 2023-01-20 NOTE — Op Note (Signed)
Orthopaedic Surgery Operative Note (CSN: [id number])  Julia Franklin  03-26-1976 Date of Surgery: 01/20/2023   Diagnoses:  right knee medial meniscus tear, synovitis  Procedure: Right medial meniscectomy partial Right synovectomy anterior medial anterior lateral compartments   Operative Finding Patient had a pristine lateral compartment as well as patellofemoral.  There is synovitis in the anterior portion of the knee.  There was a complex parrot-beak type component meniscus tear of the posterior medial meniscus.  40% total meniscal volume was resected medially.  Early grade 2 and 3 changes in the far medial aspect of the medial femoral condyle but the tibia was intact.  Successful completion of the planned procedure.    Post-operative plan: The patient will be weightbearing to tolerance.  The patient will be discharged home.  DVT prophylaxis Aspirin 81 mg twice daily for 6 weeks.  Pain control with PRN pain medication preferring oral medicines.  Follow up plan will be scheduled in approximately 7 days for incision check.  Post-Op Diagnosis: Same Surgeons:Primary: Bjorn Pippin, MD Assistants:Caroline McBane PA-C Location: [facility] OR ROOM 2 Anesthesia: General with local Antibiotics: Ancef 2 g Tourniquet time:  Estimated Blood Loss: Minimal Complications: None Specimens: None Implants: * No implants in log *  Indications for Surgery:   [name] is a 47 y.o. female with meniscus tear and mechanical symptoms.  Benefits and risks of operative and nonoperative management were discussed prior to surgery with patient/guardian(s) and informed consent form was completed.  Specific risks including infection, need for additional surgery, postmeniscectomy syndrome, continued arthrosis and pain amongst others.   Procedure:   The patient was identified properly. Informed consent was obtained and the surgical site was marked. The patient was taken up to suite where general anesthesia was induced.  The patient was placed in the supine position with a post against the surgical leg and a nonsterile tourniquet applied. The surgical leg was then prepped and draped usual sterile fashion.  A standard surgical timeout was performed.  2 standard anterior portals were made and diagnostic arthroscopy performed. Please note the findings as noted above.  We used a shaver to perform a synovectomy of the anterior medial and anterolateral compartments as well as overgrowth along the patella.  We performed a meniscectomy medial using a shaver and basket back to a stable base removing all loose fragments.  Incisions closed with absorbable suture. The patient was awoken from general anesthesia and taken to the PACU in stable condition without complication.   Alfonse Alpers, PA-C, present and scrubbed throughout the case, critical for completion in a timely fashion, and for retraction, instrumentation, closure.

## 2023-01-20 NOTE — Transfer of Care (Signed)
Immediate Anesthesia Transfer of Care Note  Patient: Julia Franklin  Procedure(s) Performed: RIGHT KNEE ARTHROSCOPY WITH MEDIAL MENISECTOMY (Right: Knee)  Patient Location: PACU  Anesthesia Type:General  Level of Consciousness: awake, alert , oriented, drowsy, and patient cooperative  Airway & Oxygen Therapy: Patient Spontanous Breathing and Patient connected to face mask oxygen  Post-op Assessment: Report given to RN and Post -op Vital signs reviewed and stable  Post vital signs: Reviewed and stable  Last Vitals:  Vitals Value Taken Time  BP    Temp    Pulse    Resp    SpO2      Last Pain:  Vitals:   01/20/23 0647  TempSrc: Oral  PainSc: 2          Complications: No notable events documented.

## 2023-01-21 ENCOUNTER — Encounter (HOSPITAL_BASED_OUTPATIENT_CLINIC_OR_DEPARTMENT_OTHER): Payer: Self-pay | Admitting: Orthopaedic Surgery

## 2023-01-21 NOTE — Anesthesia Postprocedure Evaluation (Signed)
Anesthesia Post Note  Patient: Trisha Lillard  Procedure(s) Performed: RIGHT KNEE ARTHROSCOPY WITH PARTIAL MEDIAL MENISECTOMY (Right: Knee) RIGHT KNEE SYNOVECTOMY (Right: Knee)     Patient location during evaluation: PACU Anesthesia Type: General Level of consciousness: sedated and patient cooperative Pain management: pain level controlled Vital Signs Assessment: post-procedure vital signs reviewed and stable Respiratory status: spontaneous breathing Cardiovascular status: stable Anesthetic complications: no   No notable events documented.  Last Vitals:  Vitals:   01/20/23 0845 01/20/23 0910  BP: 128/76 131/69  Pulse: 62 68  Resp: 10 18  Temp:  36.6 C  SpO2: 97% 100%    Last Pain:  Vitals:   01/20/23 0930  TempSrc:   PainSc: 2                  Lewie Loron

## 2023-08-05 ENCOUNTER — Emergency Department (HOSPITAL_COMMUNITY)
Admission: EM | Admit: 2023-08-05 | Discharge: 2023-08-05 | Disposition: A | Discharge status: Home / Self Care | Attending: Emergency Medicine | Admitting: Emergency Medicine

## 2023-08-05 ENCOUNTER — Encounter (HOSPITAL_COMMUNITY): Payer: Self-pay

## 2023-08-05 ENCOUNTER — Emergency Department (HOSPITAL_COMMUNITY)

## 2023-08-05 ENCOUNTER — Other Ambulatory Visit: Payer: Self-pay

## 2023-08-05 DIAGNOSIS — R42 Dizziness and giddiness: Secondary | ICD-10-CM | POA: Insufficient documentation

## 2023-08-05 LAB — URINALYSIS, ROUTINE W REFLEX MICROSCOPIC
Bilirubin Urine: NEGATIVE
Glucose, UA: NEGATIVE mg/dL
Ketones, ur: NEGATIVE mg/dL
Leukocytes,Ua: NEGATIVE
Nitrite: NEGATIVE
Protein, ur: NEGATIVE mg/dL
Specific Gravity, Urine: 1.002 — ABNORMAL LOW (ref 1.005–1.030)
pH: 7 (ref 5.0–8.0)

## 2023-08-05 LAB — CBC
HCT: 33.2 % — ABNORMAL LOW (ref 36.0–46.0)
Hemoglobin: 10.1 g/dL — ABNORMAL LOW (ref 12.0–15.0)
MCH: 23.9 pg — ABNORMAL LOW (ref 26.0–34.0)
MCHC: 30.4 g/dL (ref 30.0–36.0)
MCV: 78.7 fL — ABNORMAL LOW (ref 80.0–100.0)
Platelets: 362 10*3/uL (ref 150–400)
RBC: 4.22 MIL/uL (ref 3.87–5.11)
RDW: 16 % — ABNORMAL HIGH (ref 11.5–15.5)
WBC: 6.9 10*3/uL (ref 4.0–10.5)
nRBC: 0 % (ref 0.0–0.2)

## 2023-08-05 LAB — BASIC METABOLIC PANEL WITH GFR
Anion gap: 8 (ref 5–15)
BUN: 11 mg/dL (ref 6–20)
CO2: 25 mmol/L (ref 22–32)
Calcium: 9.3 mg/dL (ref 8.9–10.3)
Chloride: 105 mmol/L (ref 98–111)
Creatinine, Ser: 0.67 mg/dL (ref 0.44–1.00)
GFR, Estimated: >60 mL/min (ref >60)
Glucose, Bld: 109 mg/dL — ABNORMAL HIGH (ref 70–99)
Potassium: 3.6 mmol/L (ref 3.5–5.1)
Sodium: 138 mmol/L (ref 135–145)

## 2023-08-05 LAB — HCG, SERUM, QUALITATIVE: Preg, Serum: NEGATIVE

## 2023-08-05 LAB — CBG MONITORING, ED: Glucose-Capillary: 69 mg/dL — ABNORMAL LOW (ref 70–99)

## 2023-08-05 MED ORDER — ONDANSETRON HCL 4 MG PO TABS: 4.0000 mg | ORAL_TABLET | Freq: Four times a day (QID) | ORAL | 0 refills | Status: DC

## 2023-08-05 MED ORDER — PROCHLORPERAZINE EDISYLATE 10 MG/2ML IJ SOLN
10.0000 mg | Freq: Once | INTRAMUSCULAR | Status: AC
  Administered 2023-08-05: 10 mg via INTRAVENOUS
  Filled 2023-08-05: qty 2

## 2023-08-05 MED ORDER — MECLIZINE HCL 25 MG PO TABS: 25.0000 mg | ORAL_TABLET | Freq: Three times a day (TID) | ORAL | 0 refills | Status: DC | PRN

## 2023-08-05 MED ORDER — ONDANSETRON HCL 4 MG PO TABS: 4.0000 mg | ORAL_TABLET | Freq: Four times a day (QID) | ORAL | 0 refills | Status: AC

## 2023-08-05 MED ORDER — IOHEXOL 350 MG/ML SOLN
80.0000 mL | Freq: Once | INTRAVENOUS | Status: AC | PRN
Start: 2023-08-05 — End: 2023-08-05
  Administered 2023-08-05: 80 mL via INTRAVENOUS

## 2023-08-05 MED ORDER — DIPHENHYDRAMINE HCL 50 MG/ML IJ SOLN
12.5000 mg | Freq: Once | INTRAMUSCULAR | Status: AC
  Administered 2023-08-05: 12.5 mg via INTRAVENOUS
  Filled 2023-08-05: qty 1

## 2023-08-05 MED ORDER — MECLIZINE HCL 25 MG PO TABS: 25.0000 mg | ORAL_TABLET | Freq: Three times a day (TID) | ORAL | 0 refills | Status: AC | PRN

## 2023-08-05 NOTE — ED Provider Notes (Signed)
 [place] EMERGENCY DEPARTMENT AT [facility] Provider Note   CSN: [id number] Arrival date & time: 08/05/23  1322     History  Chief Complaint  Patient presents with   Dizziness    Julia Franklin is a 48 y.o. female.   Dizziness    Patient presents to the ED for evaluation of headache as well as facial numbness.  Patient states she noticed symptoms when she woke up this morning about 6:00.  Patient states the right side of her face feels somewhat numb.  She also felt like her balance is a bit a bit off.  She also has a headache on the right side of her head behind her eye.  She felt like she had some spots in her vision on her right side.  Patient states she does have a history of cluster headaches.  No history of TIA or stroke.  She denies any fevers or chills.  No vomiting or diarrhea.  No focal numbness or weakness in her extremities  Home Medications Prior to Admission medications   Medication Sig Start Date End Date Taking? Authorizing Provider  traMADol (ULTRAM) 50 MG tablet Take 1 tablet (50 mg total) by mouth every 6 (six) hours as needed for severe pain. 01/20/23   Vernetta Honey, PA-C      Allergies    Penicillins    Review of Systems   Review of Systems  Neurological:  Positive for dizziness.    Physical Exam Updated Vital Signs BP (!) 152/87   Pulse 81   Temp 97.8 F (36.6 C) (Oral)   Resp 17   Ht 1.6 m (5\' 3" )   Wt 79.4 kg   SpO2 100%   BMI 31.00 kg/m  Physical Exam Vitals and nursing note reviewed.  Constitutional:      General: She is not in acute distress.    Appearance: She is well-developed.  HENT:     Head: Normocephalic and atraumatic.     Right Ear: External ear normal.     Left Ear: External ear normal.  Eyes:     General: No visual field deficit or scleral icterus.       Right eye: No discharge.        Left eye: No discharge.     Conjunctiva/sclera: Conjunctivae normal.  Neck:     Trachea: No tracheal deviation.   Cardiovascular:     Rate and Rhythm: Normal rate and regular rhythm.  Pulmonary:     Effort: Pulmonary effort is normal. No respiratory distress.     Breath sounds: Normal breath sounds. No stridor. No wheezing or rales.  Abdominal:     General: Bowel sounds are normal. There is no distension.     Palpations: Abdomen is soft.     Tenderness: There is no abdominal tenderness. There is no guarding or rebound.  Musculoskeletal:        General: No tenderness.     Cervical back: Neck supple.  Skin:    General: Skin is warm and dry.     Findings: No rash.  Neurological:     Mental Status: She is alert and oriented to person, place, and time.     Cranial Nerves: No cranial nerve deficit, dysarthria or facial asymmetry.     Sensory: No sensory deficit.     Motor: No abnormal muscle tone, seizure activity or pronator drift.     Coordination: Coordination normal.     Comments:  able to hold both legs  off bed for 5 seconds, sensation intact in all extremities,  no left or right sided neglect, normal finger-nose exam bilaterally, no nystagmus noted   Psychiatric:        Mood and Affect: Mood normal.     ED Results / Procedures / Treatments   Labs (all labs ordered are listed, but only abnormal results are displayed) Labs Reviewed  BASIC METABOLIC PANEL WITH GFR - Abnormal; Notable for the following components:      Result Value   Glucose, Bld 109 (*)    All other components within normal limits  CBC - Abnormal; Notable for the following components:   Hemoglobin 10.1 (*)    HCT 33.2 (*)    MCV 78.7 (*)    MCH 23.9 (*)    RDW 16.0 (*)    All other components within normal limits  URINALYSIS, ROUTINE W REFLEX MICROSCOPIC - Abnormal; Notable for the following components:   Color, Urine COLORLESS (*)    Specific Gravity, Urine 1.002 (*)    Hgb urine dipstick SMALL (*)    Bacteria, UA RARE (*)    All other components within normal limits  CBG MONITORING, ED - Abnormal; Notable for  the following components:   Glucose-Capillary 69 (*)    All other components within normal limits  HCG, SERUM, QUALITATIVE  CBG MONITORING, ED    EKG None  Radiology No results found.  Procedures Procedures    Medications Ordered in ED Medications  prochlorperazine (COMPAZINE) injection 10 mg (10 mg Intravenous Given 08/05/23 1451)  diphenhydrAMINE (BENADRYL) injection 12.5 mg (12.5 mg Intravenous Given 08/05/23 1450)    ED Course/ Medical Decision Making/ A&P                                 Medical Decision Making Amount and/or Complexity of Data Reviewed Labs: ordered.  Risk Prescription drug management.   Pt presented with complaints of dizziness right facial numbness.  Differential includes but not limited to stroke as well as complex migraine.  Patient is not showing any signs of acute infection.  Neurologic exam is reassuring.  I do not see any focal deficits on exam however patient does describe altered sensation of the right cheek.  Will plan on CT scan imaging laboratory test.  Will try migraine cocktail.  Plan on reassessment.  Care turned over to Dr Rodena Medin at shift change        Final Clinical Impression(s) / ED Diagnoses Final diagnoses:  None    Rx / DC Orders ED Discharge Orders     None         Linwood Dibbles, MD 08/05/23 1540

## 2023-08-05 NOTE — ED Triage Notes (Addendum)
 Pt states she has been dizzy since she woke up at 0600 today. States the right side of her face feels swollen and abnormal. No facial palsy noted. No weakness in extremities. Pt also states she has been off balance today and has a right sided headache

## 2023-08-05 NOTE — ED Notes (Signed)
 Pt given juice for BS of 69

## 2023-08-05 NOTE — Discharge Instructions (Signed)
 Return for any problem.  ?

## 2023-08-05 NOTE — ED Provider Notes (Signed)
 Patient seen after prior EDP.  Patient is improved.  CT imaging and other obtained labs are without significant abnormality.  Patient desires discharge.  She understands need for close outpatient follow-up.  She was offered, but declines, additional imaging with MRI.  She feels symptomatically much improved.  Importance of close follow-up is stressed.  Strict return precautions given understood.   Wynetta Fines, MD 08/05/23 [phone]

## 2023-08-05 NOTE — ED Notes (Signed)
 Dr. Lavonna Rua and Dr. Lynelle Doctor notified of pt stroke-like symptoms. Awaiting MSE

## 2023-08-05 NOTE — ED Provider Triage Note (Signed)
 Emergency Medicine Provider Triage Evaluation Note  Julia Franklin , a 48 y.o. female  was evaluated in triage.  Pt complains of dizziness, headache, vision changes. Started this morning. Having associated posterior headache. Denies numbness or weakness but states R side of face feels different like its swollen. Has been under a lot of stress recently. Last felt normal 11 PM last night, went to bed at 3 AM.  Review of Systems  Positive: R sided facial sensation difference, headache Negative: Numbness, weakness  Physical Exam  BP (!) 152/87   Pulse 81   Temp 97.8 F (36.6 C) (Oral)   Resp 17   Ht 5\' 3"  (1.6 m)   Wt 79.4 kg   SpO2 100%   BMI 31.00 kg/m  Gen:   Awake, no distress   Resp:  Normal effort  MSK:   Moves extremities without difficulty  Other:  No neuro deficits, normal finger to nose, normal visual fields, no drift in all 4 extremities, subjectively "different" sensation on R side of face, no facial swelling  Medical Decision Making  Medically screening exam initiated at 1:42 PM.  Appropriate orders placed.  Julia Franklin was informed that the remainder of the evaluation will be completed by another provider, this initial triage assessment does not replace that evaluation, and the importance of remaining in the ED until their evaluation is complete.  No focal neurologic deficits and outside of stroke window, does not need stroke alert.    Elayne Snare K, DO 08/05/23 1348
